# Patient Record
Sex: Female | Born: 1975 | Race: White | Hispanic: No | Marital: Married | Smoking: Never smoker
Health system: Southern US, Community
[De-identification: ages and names within clinical notes are randomized; demographics above are authoritative.]

---

## 1998-03-01 ENCOUNTER — Other Ambulatory Visit: Admission: RE | Admit: 1998-03-01 | Discharge: 1998-03-01 | Payer: Self-pay | Admitting: Obstetrics and Gynecology

## 1999-03-17 ENCOUNTER — Other Ambulatory Visit: Admission: RE | Admit: 1999-03-17 | Discharge: 1999-03-17 | Payer: Self-pay | Admitting: Obstetrics and Gynecology

## 2000-04-05 ENCOUNTER — Other Ambulatory Visit: Admission: RE | Admit: 2000-04-05 | Discharge: 2000-04-05 | Payer: Self-pay | Admitting: Gynecology

## 2000-07-19 ENCOUNTER — Other Ambulatory Visit: Admission: RE | Admit: 2000-07-19 | Discharge: 2000-07-19 | Payer: Self-pay | Admitting: Gynecology

## 2001-04-07 ENCOUNTER — Other Ambulatory Visit: Admission: RE | Admit: 2001-04-07 | Discharge: 2001-04-07 | Payer: Self-pay | Admitting: Gynecology

## 2002-05-05 ENCOUNTER — Other Ambulatory Visit: Admission: RE | Admit: 2002-05-05 | Discharge: 2002-05-05 | Payer: Self-pay | Admitting: Gynecology

## 2003-05-11 ENCOUNTER — Other Ambulatory Visit: Admission: RE | Admit: 2003-05-11 | Discharge: 2003-05-11 | Payer: Self-pay | Admitting: Gynecology

## 2003-12-05 ENCOUNTER — Other Ambulatory Visit: Admission: RE | Admit: 2003-12-05 | Discharge: 2003-12-05 | Payer: Self-pay | Admitting: Gynecology

## 2004-06-19 ENCOUNTER — Inpatient Hospital Stay (HOSPITAL_COMMUNITY): Admission: AD | Admit: 2004-06-19 | Discharge: 2004-06-21 | Payer: Self-pay | Admitting: Gynecology

## 2004-07-31 ENCOUNTER — Other Ambulatory Visit: Admission: RE | Admit: 2004-07-31 | Discharge: 2004-07-31 | Payer: Self-pay | Admitting: Gynecology

## 2005-08-10 ENCOUNTER — Other Ambulatory Visit: Admission: RE | Admit: 2005-08-10 | Discharge: 2005-08-10 | Payer: Self-pay | Admitting: Gynecology

## 2006-08-20 ENCOUNTER — Other Ambulatory Visit: Admission: RE | Admit: 2006-08-20 | Discharge: 2006-08-20 | Payer: Self-pay | Admitting: Gynecology

## 2007-08-30 ENCOUNTER — Other Ambulatory Visit: Admission: RE | Admit: 2007-08-30 | Discharge: 2007-08-30 | Payer: Self-pay | Admitting: Gynecology

## 2008-09-21 ENCOUNTER — Ambulatory Visit: Payer: Self-pay | Admitting: Gynecology

## 2008-09-21 ENCOUNTER — Encounter: Payer: Self-pay | Admitting: Gynecology

## 2008-09-21 ENCOUNTER — Other Ambulatory Visit: Admission: RE | Admit: 2008-09-21 | Discharge: 2008-09-21 | Payer: Self-pay | Admitting: Gynecology

## 2009-11-01 ENCOUNTER — Ambulatory Visit: Payer: Self-pay | Admitting: Gynecology

## 2009-11-18 ENCOUNTER — Ambulatory Visit: Payer: Self-pay | Admitting: Gynecology

## 2010-06-29 ENCOUNTER — Inpatient Hospital Stay (HOSPITAL_COMMUNITY): Admission: AD | Admit: 2010-06-29 | Discharge: 2010-07-01 | Payer: Self-pay | Admitting: Obstetrics and Gynecology

## 2010-11-12 LAB — RPR: RPR Ser Ql: NONREACTIVE

## 2010-11-12 LAB — CBC
Hemoglobin: 12.8 g/dL (ref 12.0–15.0)
MCV: 91.2 fL (ref 78.0–100.0)
Platelets: 293 10*3/uL (ref 150–400)
RBC: 4.16 MIL/uL (ref 3.87–5.11)
WBC: 12.1 10*3/uL — ABNORMAL HIGH (ref 4.0–10.5)

## 2011-03-17 ENCOUNTER — Encounter (INDEPENDENT_AMBULATORY_CARE_PROVIDER_SITE_OTHER): Payer: BC Managed Care – PPO | Admitting: Gynecology

## 2011-03-17 ENCOUNTER — Other Ambulatory Visit: Payer: Self-pay | Admitting: Gynecology

## 2011-03-17 ENCOUNTER — Other Ambulatory Visit (HOSPITAL_COMMUNITY)
Admission: RE | Admit: 2011-03-17 | Discharge: 2011-03-17 | Disposition: A | Discharge status: Home / Self Care | Payer: BC Managed Care – PPO | Source: Ambulatory Visit | Attending: Gynecology | Admitting: Gynecology

## 2011-03-17 DIAGNOSIS — Z124 Encounter for screening for malignant neoplasm of cervix: Secondary | ICD-10-CM | POA: Insufficient documentation

## 2011-03-17 DIAGNOSIS — Z01419 Encounter for gynecological examination (general) (routine) without abnormal findings: Secondary | ICD-10-CM

## 2011-03-17 DIAGNOSIS — Z13220 Encounter for screening for lipoid disorders: Secondary | ICD-10-CM

## 2011-03-17 DIAGNOSIS — B373 Candidiasis of vulva and vagina: Secondary | ICD-10-CM

## 2011-03-17 DIAGNOSIS — Z833 Family history of diabetes mellitus: Secondary | ICD-10-CM

## 2013-01-12 ENCOUNTER — Encounter: Payer: Self-pay | Admitting: Gynecology

## 2013-01-13 ENCOUNTER — Encounter: Payer: Self-pay | Admitting: Gynecology

## 2013-01-13 ENCOUNTER — Encounter: Payer: Self-pay | Admitting: Women's Health

## 2013-08-04 ENCOUNTER — Ambulatory Visit (INDEPENDENT_AMBULATORY_CARE_PROVIDER_SITE_OTHER): Payer: BC Managed Care – PPO | Admitting: Women's Health

## 2013-08-04 ENCOUNTER — Other Ambulatory Visit (HOSPITAL_COMMUNITY)
Admission: RE | Admit: 2013-08-04 | Discharge: 2013-08-04 | Disposition: A | Discharge status: Home / Self Care | Payer: BC Managed Care – PPO | Source: Ambulatory Visit | Attending: Gynecology | Admitting: Gynecology

## 2013-08-04 ENCOUNTER — Encounter: Payer: Self-pay | Admitting: Women's Health

## 2013-08-04 VITALS — BP 116/70 | Ht 63.5 in | Wt 164.8 lb

## 2013-08-04 DIAGNOSIS — Z01419 Encounter for gynecological examination (general) (routine) without abnormal findings: Secondary | ICD-10-CM | POA: Insufficient documentation

## 2013-08-04 DIAGNOSIS — Z13220 Encounter for screening for lipoid disorders: Secondary | ICD-10-CM

## 2013-08-04 LAB — URINALYSIS W MICROSCOPIC + REFLEX CULTURE
Casts: NONE SEEN
Crystals: NONE SEEN
Leukocytes, UA: NEGATIVE
Nitrite: NEGATIVE
Specific Gravity, Urine: 1.022 (ref 1.005–1.030)
pH: 5.5 (ref 5.0–8.0)

## 2013-08-04 LAB — CBC WITH DIFFERENTIAL/PLATELET
Lymphocytes Relative: 25 % (ref 12–46)
Lymphs Abs: 2.9 10*3/uL (ref 0.7–4.0)
MCV: 84.6 fL (ref 78.0–100.0)
Neutrophils Relative %: 65 % (ref 43–77)
Platelets: 333 10*3/uL (ref 150–400)
RBC: 4.74 MIL/uL (ref 3.87–5.11)
WBC: 11.9 10*3/uL — ABNORMAL HIGH (ref 4.0–10.5)

## 2013-08-04 LAB — LIPID PANEL
Total CHOL/HDL Ratio: 3.1 Ratio
VLDL: 26 mg/dL (ref 0–40)

## 2013-08-04 NOTE — Patient Instructions (Signed)

## 2013-08-04 NOTE — Addendum Note (Signed)
Addended by: Richardson Chiquito on: 08/04/2013 11:17 AM   Modules accepted: Orders

## 2013-08-04 NOTE — Progress Notes (Signed)
Stephanie Wheeler 07/22/1976 [id number]    History:    The patient presents for annual exam.  Regular monthly cycle/vasectomy. Normal Pap history.  Past medical history, past surgical history, family history and social history were all reviewed and documented in the EPIC chart. Curriculum facilitator at a school. [place] 7, Lequita Halt 3 both doing well. Mother fibromyalgia.   ROS:  A  ROS was performed and pertinent positives and negatives are included in the history.  Exam:  Filed Vitals:   08/04/13 0940  BP: 116/70    General appearance:  Normal Head/Neck:  Normal, without cervical or supraclavicular adenopathy. Thyroid:  Symmetrical, normal in size, without palpable masses or nodularity. Respiratory  Effort:  Normal  Auscultation:  Clear without wheezing or rhonchi Cardiovascular  Auscultation:  Regular rate, without rubs, murmurs or gallops  Edema/varicosities:  Not grossly evident Abdominal  Soft,nontender, without masses, guarding or rebound.  Liver/spleen:  No organomegaly noted  Hernia:  None appreciated  Skin  Inspection:  Grossly normal  Palpation:  Grossly normal Neurologic/psychiatric  Orientation:  Normal with appropriate conversation.  Mood/affect:  Normal  Genitourinary    Breasts: Examined lying and sitting.     Right: Without masses, retractions, discharge or axillary adenopathy.     Left: Without masses, retractions, discharge or axillary adenopathy.   Inguinal/mons:  Normal without inguinal adenopathy  External genitalia:  Normal  BUS/Urethra/Skene's glands:  Normal  Bladder:  Normal  Vagina:  Normal  Cervix:  Normal  Uterus:   normal in size, shape and contour.  Midline and mobile  Adnexa/parametria:     Rt: Without masses or tenderness.   Lt: Without masses or tenderness.  Anus and perineum: Normal  Digital rectal exam: Normal sphincter tone without palpated masses or tenderness  Assessment/Plan:  37 y.o. MWF G2P2  for annual exam with no  complaints.  Normal GYN exam/vasectomy  Plan: SBE's, regular exercise, calcium rich diet, vitamin D 1000 daily encouraged. CBC, lipid panel, UA, Pap. Pap normal 2012, new screening guidelines reviewed.     Harrington Challenger WHNP, 10:40 AM 08/04/2013

## 2013-10-09 ENCOUNTER — Encounter: Payer: Self-pay | Admitting: Gynecology

## 2013-10-09 ENCOUNTER — Ambulatory Visit (INDEPENDENT_AMBULATORY_CARE_PROVIDER_SITE_OTHER): Payer: BC Managed Care – PPO | Admitting: Gynecology

## 2013-10-09 DIAGNOSIS — N9089 Other specified noninflammatory disorders of vulva and perineum: Secondary | ICD-10-CM

## 2013-10-09 DIAGNOSIS — N907 Vulvar cyst: Secondary | ICD-10-CM

## 2013-10-09 NOTE — Patient Instructions (Signed)
followup if area does not resolve. Otherwise annually for your exam when due

## 2013-10-09 NOTE — Progress Notes (Signed)
Stephanie Wheeler 05/30/1976 [id number]        38 y.o.  [name] presents feeling a "paper cut" type feeling left labia over the last several days. Husband looked last night and saw a red raised area. No history of similar before. No urinary symptoms, vaginal discharge, abnormal bleeding.  Past medical history,surgical history, problem list, medications, allergies, family history and social history were all reviewed and documented in the EPIC chart.  Exam: Kim assistant General appearance  Normal  External BUS vagina with small pustule Outer mid left labia minora. Draining slight amounts of pus with pressure. No other vulvar lesions. Vagina normal to inspection and palpation. Cervix normal. Uterus normal size midline mobile nontender. Adnexa without masses or tenderness.  Assessment/Plan:  38 y.o. [name]  with small labial pustule now draining. Patient will follow and I suspect this will spontaneously resolve. Warm soaks discussed. Followup if persists,worsens or recurs.   Note: This document was prepared with digital dictation and possible smart phrase technology. Any transcriptional errors that result from this process are unintentional.   Dara LordsFONTAINE,Rodina Pinales P MD, 12:42 PM 10/09/2013

## 2013-10-11 ENCOUNTER — Ambulatory Visit: Payer: BC Managed Care – PPO | Admitting: Women's Health

## 2014-07-02 ENCOUNTER — Encounter: Payer: Self-pay | Admitting: Gynecology

## 2016-08-06 ENCOUNTER — Other Ambulatory Visit: Payer: Self-pay | Admitting: Women's Health

## 2016-08-06 DIAGNOSIS — Z1231 Encounter for screening mammogram for malignant neoplasm of breast: Secondary | ICD-10-CM

## 2016-08-27 ENCOUNTER — Ambulatory Visit (INDEPENDENT_AMBULATORY_CARE_PROVIDER_SITE_OTHER): Payer: BC Managed Care – PPO | Admitting: Women's Health

## 2016-08-27 ENCOUNTER — Encounter: Payer: Self-pay | Admitting: Women's Health

## 2016-08-27 VITALS — BP 120/78 | Ht 64.0 in | Wt 167.8 lb

## 2016-08-27 DIAGNOSIS — Z13220 Encounter for screening for lipoid disorders: Secondary | ICD-10-CM

## 2016-08-27 DIAGNOSIS — Z1329 Encounter for screening for other suspected endocrine disorder: Secondary | ICD-10-CM | POA: Diagnosis not present

## 2016-08-27 DIAGNOSIS — Z01419 Encounter for gynecological examination (general) (routine) without abnormal findings: Secondary | ICD-10-CM

## 2016-08-27 DIAGNOSIS — N920 Excessive and frequent menstruation with regular cycle: Secondary | ICD-10-CM | POA: Diagnosis not present

## 2016-08-27 DIAGNOSIS — Z1151 Encounter for screening for human papillomavirus (HPV): Secondary | ICD-10-CM | POA: Diagnosis not present

## 2016-08-27 LAB — CBC WITH DIFFERENTIAL/PLATELET
BASOS ABS: 87 {cells}/uL (ref 0–200)
Basophils Relative: 1 %
EOS ABS: 261 {cells}/uL (ref 15–500)
Eosinophils Relative: 3 %
HCT: 40.3 % (ref 35.0–45.0)
HEMOGLOBIN: 13.4 g/dL (ref 11.7–15.5)
LYMPHS ABS: 2610 {cells}/uL (ref 850–3900)
Lymphocytes Relative: 30 %
MCH: 27.9 pg (ref 27.0–33.0)
MCHC: 33.3 g/dL (ref 32.0–36.0)
MCV: 84 fL (ref 80.0–100.0)
MPV: 9.4 fL (ref 7.5–12.5)
Monocytes Absolute: 609 cells/uL (ref 200–950)
Monocytes Relative: 7 %
NEUTROS ABS: 5133 {cells}/uL (ref 1500–7800)
Neutrophils Relative %: 59 %
Platelets: 334 10*3/uL (ref 140–400)
RBC: 4.8 MIL/uL (ref 3.80–5.10)
RDW: 13.5 % (ref 11.0–15.0)
WBC: 8.7 10*3/uL (ref 3.8–10.8)

## 2016-08-27 LAB — COMPREHENSIVE METABOLIC PANEL
ALBUMIN: 4 g/dL (ref 3.6–5.1)
ALT: 17 U/L (ref 6–29)
AST: 20 U/L (ref 10–30)
Alkaline Phosphatase: 55 U/L (ref 33–115)
BUN: 17 mg/dL (ref 7–25)
CALCIUM: 9.1 mg/dL (ref 8.6–10.2)
CHLORIDE: 107 mmol/L (ref 98–110)
CO2: 22 mmol/L (ref 20–31)
Creat: 0.69 mg/dL (ref 0.50–1.10)
Glucose, Bld: 92 mg/dL (ref 65–99)
POTASSIUM: 4.5 mmol/L (ref 3.5–5.3)
Sodium: 137 mmol/L (ref 135–146)
TOTAL PROTEIN: 6.6 g/dL (ref 6.1–8.1)
Total Bilirubin: 0.3 mg/dL (ref 0.2–1.2)

## 2016-08-27 LAB — LIPID PANEL
CHOL/HDL RATIO: 2.8 ratio (ref <5.0)
CHOLESTEROL: 158 mg/dL (ref <200)
HDL: 56 mg/dL (ref >50)
LDL Cholesterol: 80 mg/dL (ref <100)
TRIGLYCERIDES: 111 mg/dL (ref <150)
VLDL: 22 mg/dL (ref <30)

## 2016-08-27 LAB — TSH: TSH: 1.44 mIU/L

## 2016-08-27 NOTE — Addendum Note (Signed)
Addended by: Aura CampsWEBB, JENNIFER L on: 08/27/2016 11:53 AM   Modules accepted: Orders

## 2016-08-27 NOTE — Progress Notes (Signed)
Stephanie Wheeler 03/31/1976 [id number]    History:    Presents for annual exam.  Monthly 5 day cycle ,2 days are heavy changing protection every hour. Vasectomy. Cycles have gotten progressively heavier since delivery of last child 6 years ago. Has not had a screening mammogram she does have one scheduled next week. Normal Pap history.  Past medical history, past surgical history, family history and social history were all reviewed and documented in the EPIC chart. 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  ROS:  A ROS was performed and pertinent positives and negatives are included.  Exam:  Vitals:   08/27/16 1053  BP: 120/78  Weight: 167 lb 12.8 oz (76.1 kg)  Height: 5\' 4"  (1.626 m)   Body mass index is 28.8 kg/m.   General appearance:  Normal Thyroid:  Symmetrical, normal in size, without palpable masses or nodularity. Respiratory  Auscultation:  Clear without wheezing or rhonchi Cardiovascular  Auscultation:  Regular rate, without rubs, murmurs or gallops  Edema/varicosities:  Not grossly evident Abdominal  Soft,nontender, without masses, guarding or rebound.  Liver/spleen:  No organomegaly noted  Hernia:  None appreciated  Skin  Inspection:  Grossly normal   Breasts: Examined lying and sitting.     Right: Without masses, retractions, discharge or axillary adenopathy.     Left: Without masses, retractions, discharge or axillary adenopathy. Gentitourinary   Inguinal/mons:  Normal without inguinal adenopathy  External genitalia:  Normal  BUS/Urethra/Skene's glands:  Normal  Vagina:  Normal  Cervix:  Normal  Uterus:   normal in size, shape and contour.  Midline and mobile  Adnexa/parametria:     Rt: Without masses or tenderness.   Lt: Without masses or tenderness.  Anus and perineum: Normal  Digital rectal exam: Normal sphincter tone without palpated masses or tenderness  Assessment/Plan:  40 y.o. MWF G2P2 for annual exam.      Monthly cycle with menorrhagia/vasectomy  Plan: Options reviewed, will schedule sonohysterogram with Dr. Audie BoxFontaine after next cycle, ablation reviewed, SBE's, keep scheduled annual screening mammogram reviewed importance of annual screen. Continue healthy lifestyle of exercise and healthy diet. CBC, lipid panel, CMP, TSH, vitamin D, UA, Pap with HR HPV typing, new screening guidelines reviewed.    [name],Stephanie Wheeler J [facility] CampusWHNP, 11:32 AM 08/27/2016

## 2016-08-27 NOTE — Patient Instructions (Signed)
Health Maintenance, Female Introduction Adopting a healthy lifestyle and getting preventive care can go a long way to promote health and wellness. Talk with your health care provider about what schedule of regular examinations is right for you. This is a good chance for you to check in with your provider about disease prevention and staying healthy. In between checkups, there are plenty of things you can do on your own. Experts have done a lot of research about which lifestyle changes and preventive measures are most likely to keep you healthy. Ask your health care provider for more information. Weight and diet Eat a healthy diet  Be sure to include plenty of vegetables, fruits, low-fat dairy products, and lean protein.  Do not eat a lot of foods high in solid fats, added sugars, or salt.  Get regular exercise. This is one of the most important things you can do for your health.  Most adults should exercise for at least 150 minutes each week. The exercise should increase your heart rate and make you sweat (moderate-intensity exercise).  Most adults should also do strengthening exercises at least twice a week. This is in addition to the moderate-intensity exercise. Maintain a healthy weight  Body mass index (BMI) is a measurement that can be used to identify possible weight problems. It estimates body fat based on height and weight. Your health care provider can help determine your BMI and help you achieve or maintain a healthy weight.  For females 42 years of age and older:  A BMI below 18.5 is considered underweight.  A BMI of 18.5 to 24.9 is normal.  A BMI of 25 to 29.9 is considered overweight.  A BMI of 30 and above is considered obese. Watch levels of cholesterol and blood lipids  You should start having your blood tested for lipids and cholesterol at 42 years of age, then have this test every 5 years.  You may need to have your cholesterol levels checked more often if:  Your  lipid or cholesterol levels are high.  You are older than 42 years of age.  You are at high risk for heart disease. Cancer screening Lung Cancer  Lung cancer screening is recommended for adults 42-22 years old who are at high risk for lung cancer because of a history of smoking.  A yearly low-dose CT scan of the lungs is recommended for people who:  Currently smoke.  Have quit within the past 15 years.  Have at least a 30-pack-year history of smoking. A pack year is smoking an average of one pack of cigarettes a day for 1 year.  Yearly screening should continue until it has been 15 years since you quit.  Yearly screening should stop if you develop a health problem that would prevent you from having lung cancer treatment. Breast Cancer  Practice breast self-awareness. This means understanding how your breasts normally appear and feel.  It also means doing regular breast self-exams. Let your health care provider know about any changes, no matter how small.  If you are in your 42s or 30s, you should have a clinical breast exam (CBE) by a health care provider every 1-3 years as part of a regular health exam.  If you are 42 or older, have a CBE every year. Also consider having a breast X-ray (mammogram) every year.  If you have a family history of breast cancer, talk to your health care provider about genetic screening.  If you are at high risk for breast cancer,  talk to your health care provider about having an MRI and a mammogram every year.  Breast cancer gene (BRCA) assessment is recommended for women who have family members with BRCA-related cancers. BRCA-related cancers include:  Breast.  Ovarian.  Tubal.  Peritoneal cancers.  Results of the assessment will determine the need for genetic counseling and BRCA1 and BRCA2 testing. Cervical Cancer  Your health care provider may recommend that you be screened regularly for cancer of the pelvic organs (ovaries, uterus, and  vagina). This screening involves a pelvic examination, including checking for microscopic changes to the surface of your cervix (Pap test). You may be encouraged to have this screening done every 3 years, beginning at age 42.  For women ages 30-65, health care providers may recommend pelvic exams and Pap testing every 3 years, or they may recommend the Pap and pelvic exam, combined with testing for human papilloma virus (HPV), every 5 years. Some types of HPV increase your risk of cervical cancer. Testing for HPV may also be done on women of any age with unclear Pap test results.  Other health care providers may not recommend any screening for nonpregnant women who are considered low risk for pelvic cancer and who do not have symptoms. Ask your health care provider if a screening pelvic exam is right for you.  If you have had past treatment for cervical cancer or a condition that could lead to cancer, you need Pap tests and screening for cancer for at least 20 years after your treatment. If Pap tests have been discontinued, your risk factors (such as having a new sexual partner) need to be reassessed to determine if screening should resume. Some women have medical problems that increase the chance of getting cervical cancer. In these cases, your health care provider may recommend more frequent screening and Pap tests. Colorectal Cancer  This type of cancer can be detected and often prevented.  Routine colorectal cancer screening usually begins at 42 years of age and continues through 42 years of age.  Your health care provider may recommend screening at an earlier age if you have risk factors for colon cancer.  Your health care provider may also recommend using home test kits to check for hidden blood in the stool.  A small camera at the end of a tube can be used to examine your colon directly (sigmoidoscopy or colonoscopy). This is done to check for the earliest forms of colorectal  cancer.  Routine screening usually begins at age 50.  Direct examination of the colon should be repeated every 5-10 years through 42 years of age. However, you may need to be screened more often if early forms of precancerous polyps or small growths are found. Skin Cancer  Check your skin from head to toe regularly.  Tell your health care provider about any new moles or changes in moles, especially if there is a change in a mole's shape or color.  Also tell your health care provider if you have a mole that is larger than the size of a pencil eraser.  Always use sunscreen. Apply sunscreen liberally and repeatedly throughout the day.  Protect yourself by wearing long sleeves, pants, a wide-brimmed hat, and sunglasses whenever you are outside. Heart disease, diabetes, and high blood pressure  High blood pressure causes heart disease and increases the risk of stroke. High blood pressure is more likely to develop in:  People who have blood pressure in the high end of the normal range (130-139/85-89 mm Hg).    People who are overweight or obese.  People who are African American.  If you are 18-39 years of age, have your blood pressure checked every 3-5 years. If you are 40 years of age or older, have your blood pressure checked every year. You should have your blood pressure measured twice-once when you are at a hospital or clinic, and once when you are not at a hospital or clinic. Record the average of the two measurements. To check your blood pressure when you are not at a hospital or clinic, you can use:  An automated blood pressure machine at a pharmacy.  A home blood pressure monitor.  If you are between 55 years and 79 years old, ask your health care provider if you should take aspirin to prevent strokes.  Have regular diabetes screenings. This involves taking a blood sample to check your fasting blood sugar level.  If you are at a normal weight and have a low risk for diabetes,  have this test once every three years after 42 years of age.  If you are overweight and have a high risk for diabetes, consider being tested at a younger age or more often. Preventing infection Hepatitis B  If you have a higher risk for hepatitis B, you should be screened for this virus. You are considered at high risk for hepatitis B if:  You were born in a country where hepatitis B is common. Ask your health care provider which countries are considered high risk.  Your parents were born in a high-risk country, and you have not been immunized against hepatitis B (hepatitis B vaccine).  You have HIV or AIDS.  You use needles to inject street drugs.  You live with someone who has hepatitis B.  You have had sex with someone who has hepatitis B.  You get hemodialysis treatment.  You take certain medicines for conditions, including cancer, organ transplantation, and autoimmune conditions. Hepatitis C  Blood testing is recommended for:  Everyone born from 1945 through 1965.  Anyone with known risk factors for hepatitis C. Sexually transmitted infections (STIs)  You should be screened for sexually transmitted infections (STIs) including gonorrhea and chlamydia if:  You are sexually active and are younger than 42 years of age.  You are older than 42 years of age and your health care provider tells you that you are at risk for this type of infection.  Your sexual activity has changed since you were last screened and you are at an increased risk for chlamydia or gonorrhea. Ask your health care provider if you are at risk.  If you do not have HIV, but are at risk, it may be recommended that you take a prescription medicine daily to prevent HIV infection. This is called pre-exposure prophylaxis (PrEP). You are considered at risk if:  You are sexually active and do not regularly use condoms or know the HIV status of your partner(s).  You take drugs by injection.  You are sexually  active with a partner who has HIV. Talk with your health care provider about whether you are at high risk of being infected with HIV. If you choose to begin PrEP, you should first be tested for HIV. You should then be tested every 3 months for as long as you are taking PrEP. Pregnancy  If you are premenopausal and you may become pregnant, ask your health care provider about preconception counseling.  If you may become pregnant, take 400 to 800 micrograms (mcg) of folic acid   every day.  If you want to prevent pregnancy, talk to your health care provider about birth control (contraception). Osteoporosis and menopause  Osteoporosis is a disease in which the bones lose minerals and strength with aging. This can result in serious bone fractures. Your risk for osteoporosis can be identified using a bone density scan.  If you are 65 years of age or older, or if you are at risk for osteoporosis and fractures, ask your health care provider if you should be screened.  Ask your health care provider whether you should take a calcium or vitamin D supplement to lower your risk for osteoporosis.  Menopause may have certain physical symptoms and risks.  Hormone replacement therapy may reduce some of these symptoms and risks. Talk to your health care provider about whether hormone replacement therapy is right for you. Follow these instructions at home:  Schedule regular health, dental, and eye exams.  Stay current with your immunizations.  Do not use any tobacco products including cigarettes, chewing tobacco, or electronic cigarettes.  If you are pregnant, do not drink alcohol.  If you are breastfeeding, limit how much and how often you drink alcohol.  Limit alcohol intake to no more than 1 drink per day for nonpregnant women. One drink equals 12 ounces of beer, 5 ounces of wine, or 1 ounces of hard liquor.  Do not use street drugs.  Do not share needles.  Ask your health care provider for  help if you need support or information about quitting drugs.  Tell your health care provider if you often feel depressed.  Tell your health care provider if you have ever been abused or do not feel safe at home. This information is not intended to replace advice given to you by your health care provider. Make sure you discuss any questions you have with your health care provider. Document Released: 03/02/2011 Document Revised: 01/23/2016 Document Reviewed: 05/21/2015  2017 Elsevier  Endometrial Ablation Endometrial ablation removes the lining of the uterus (endometrium). It is usually a same-day, outpatient treatment. Ablation helps avoid major surgery, such as surgery to remove the cervix and uterus (hysterectomy). After endometrial ablation, you will have little or no menstrual bleeding and may not be able to have children. However, if you are premenopausal, you will need to use a reliable method of birth control following the procedure because of the small chance that pregnancy can occur. There are different reasons to have this procedure. These reasons include:  Heavy periods.  Bleeding that is causing anemia.  Irregular bleeding.  Bleeding fibroids on the lining inside the uterus if they are smaller than 3 centimeters. This procedure may not be possible for you if:   You want to have children in the future.   You have severe cramps with your menstrual period.   You have precancerous or cancerous cells in your uterus.   You were recently pregnant.   You have gone through menopause.   You have had major surgery on your uterus, resulting in thinning of the uterine wall. Surgeries may include:  The removal of one or more uterine fibroids (myomectomy).  A cesarean section with a classic (vertical) incision on your uterus. Ask your health care provider what type of cesarean you had. Sometimes the scar on your skin is different than the scar on your uterus. Even if you have  had surgery on your uterus, certain types of ablation may still be safe for you. Talk with your health care provider. LET YOUR   HEALTH CARE PROVIDER KNOW ABOUT:  Any allergies you have.  All medicines you are taking, including vitamins, herbs, eye drops, creams, and over-the-counter medicines.  Previous problems you or members of your family have had with the use of anesthetics.  Any blood disorders you have.  Previous surgeries you have had.  Medical conditions you have. RISKS AND COMPLICATIONS  Generally, this is a safe procedure. However, as with any procedure, complications can occur. Possible complications include:  Perforation of the uterus.  Bleeding.  Infection of the uterus, bladder, or vagina.  Injury to surrounding organs.  An air bubble to the lung (air embolus).  Pregnancy following the procedure.  Failure of the procedure to help the problem, requiring hysterectomy.  Decreased ability to diagnose cancer in the lining of the uterus. BEFORE THE PROCEDURE  The lining of the uterus must be tested to make sure there is no pre-cancerous or cancer cells present.  An ultrasound may be performed to look at the size of the uterus and to check for abnormalities.  Medicines may be given to thin the lining of the uterus. PROCEDURE  During the procedure, your health care provider will use a tool called a resectoscope to help see inside your uterus. There are different ways to remove the lining of your uterus.   Radiofrequency - This method uses a radiofrequency-alternating electric current to remove the lining of the uterus.  Cryotherapy - This method uses extreme cold to freeze the lining of the uterus.  Heated-Free Liquid - This method uses heated salt (saline) solution to remove the lining of the uterus.  Microwave - This method uses high-energy microwaves to heat up the lining of the uterus to remove it.  Thermal balloon - This method involves inserting a catheter  with a balloon tip into the uterus. The balloon tip is filled with heated fluid to remove the lining of the uterus. AFTER THE PROCEDURE  After your procedure, do not have sexual intercourse or insert anything into your vagina until permitted by your health care provider. After the procedure, you may experience:  Cramps.  Vaginal discharge.  Frequent urination. This information is not intended to replace advice given to you by your health care provider. Make sure you discuss any questions you have with your health care provider. Document Released: 06/26/2004 Document Revised: 05/08/2015 Document Reviewed: 01/18/2013 Elsevier Interactive Patient Education  2017 [employer]  

## 2016-08-28 ENCOUNTER — Encounter: Payer: Self-pay | Admitting: Women's Health

## 2016-08-28 LAB — URINALYSIS W MICROSCOPIC + REFLEX CULTURE
Bacteria, UA: NONE SEEN [HPF]
Bilirubin Urine: NEGATIVE
CASTS: NONE SEEN [LPF]
CRYSTALS: NONE SEEN [HPF]
Glucose, UA: NEGATIVE
HGB URINE DIPSTICK: NEGATIVE
KETONES UR: NEGATIVE
Leukocytes, UA: NEGATIVE
NITRITE: NEGATIVE
PH: 6 (ref 5.0–8.0)
Protein, ur: NEGATIVE
RBC / HPF: NONE SEEN RBC/HPF (ref <=2)
SQUAMOUS EPITHELIAL / LPF: NONE SEEN [HPF] (ref <=5)
Specific Gravity, Urine: 1.01 (ref 1.001–1.035)
WBC, UA: NONE SEEN WBC/HPF (ref <=5)
Yeast: NONE SEEN [HPF]

## 2016-08-28 LAB — VITAMIN D 25 HYDROXY (VIT D DEFICIENCY, FRACTURES): Vit D, 25-Hydroxy: 44 ng/mL (ref 30–100)

## 2016-09-01 ENCOUNTER — Ambulatory Visit
Admission: RE | Admit: 2016-09-01 | Discharge: 2016-09-01 | Disposition: A | Discharge status: Home / Self Care | Payer: BC Managed Care – PPO | Source: Ambulatory Visit | Attending: Women's Health | Admitting: Women's Health

## 2016-09-01 ENCOUNTER — Encounter: Payer: Self-pay | Admitting: Women's Health

## 2016-09-01 DIAGNOSIS — Z1231 Encounter for screening mammogram for malignant neoplasm of breast: Secondary | ICD-10-CM

## 2016-09-01 LAB — PAP, TP IMAGING W/ HPV RNA, RFLX HPV TYPE 16,18/45: HPV mRNA, High Risk: NOT DETECTED

## 2019-06-05 ENCOUNTER — Other Ambulatory Visit: Payer: Self-pay | Admitting: Women's Health

## 2019-06-05 DIAGNOSIS — Z1231 Encounter for screening mammogram for malignant neoplasm of breast: Secondary | ICD-10-CM

## 2019-07-11 ENCOUNTER — Other Ambulatory Visit: Payer: Self-pay

## 2019-07-12 ENCOUNTER — Encounter: Payer: Self-pay | Admitting: Women's Health

## 2019-07-12 ENCOUNTER — Ambulatory Visit: Payer: BC Managed Care – PPO | Admitting: Women's Health

## 2019-07-12 VITALS — BP 110/78 | Ht 64.0 in | Wt 167.0 lb

## 2019-07-12 DIAGNOSIS — Z23 Encounter for immunization: Secondary | ICD-10-CM

## 2019-07-12 DIAGNOSIS — Z01419 Encounter for gynecological examination (general) (routine) without abnormal findings: Secondary | ICD-10-CM | POA: Diagnosis not present

## 2019-07-12 NOTE — Patient Instructions (Signed)
Vit D 1000 iu daily good to see you! Health Maintenance, Female Adopting a healthy lifestyle and getting preventive care are important in promoting health and wellness. Ask your health care provider about:  The right schedule for you to have regular tests and exams.  Things you can do on your own to prevent diseases and keep yourself healthy. What should I know about diet, weight, and exercise? Eat a healthy diet   Eat a diet that includes plenty of vegetables, fruits, low-fat dairy products, and lean protein.  Do not eat a lot of foods that are high in solid fats, added sugars, or sodium. Maintain a healthy weight Body mass index (BMI) is used to identify weight problems. It estimates body fat based on height and weight. Your health care provider can help determine your BMI and help you achieve or maintain a healthy weight. Get regular exercise Get regular exercise. This is one of the most important things you can do for your health. Most adults should:  Exercise for at least 150 minutes each week. The exercise should increase your heart rate and make you sweat (moderate-intensity exercise).  Do strengthening exercises at least twice a week. This is in addition to the moderate-intensity exercise.  Spend less time sitting. Even light physical activity can be beneficial. Watch cholesterol and blood lipids Have your blood tested for lipids and cholesterol at 45 years of age, then have this test every 5 years. Have your cholesterol levels checked more often if:  Your lipid or cholesterol levels are high.  You are older than 45 years of age.  You are at high risk for heart disease. What should I know about cancer screening? Depending on your health history and family history, you may need to have cancer screening at various ages. This may include screening for:  Breast cancer.  Cervical cancer.  Colorectal cancer.  Skin cancer.  Lung cancer. What should I know about heart  disease, diabetes, and high blood pressure? Blood pressure and heart disease  High blood pressure causes heart disease and increases the risk of stroke. This is more likely to develop in people who have high blood pressure readings, are of African descent, or are overweight.  Have your blood pressure checked: ? Every 3-5 years if you are 36-89 years of age. ? Every year if you are 24 years old or older. Diabetes Have regular diabetes screenings. This checks your fasting blood sugar level. Have the screening done:  Once every three years after age 45 if you are at a normal weight and have a low risk for diabetes.  More often and at a younger age if you are overweight or have a high risk for diabetes. What should I know about preventing infection? Hepatitis B If you have a higher risk for hepatitis B, you should be screened for this virus. Talk with your health care provider to find out if you are at risk for hepatitis B infection. Hepatitis C Testing is recommended for:  Everyone born from [birth year] through 1967.  Anyone with known risk factors for hepatitis C. Sexually transmitted infections (STIs)  Get screened for STIs, including gonorrhea and chlamydia, if: ? You are sexually active and are younger than 45 years of age. ? You are older than 46 years of age and your health care provider tells you that you are at risk for this type of infection. ? Your sexual activity has changed since you were last screened, and you are at increased risk for  chlamydia or gonorrhea. Ask your health care provider if you are at risk.  Ask your health care provider about whether you are at high risk for HIV. Your health care provider may recommend a prescription medicine to help prevent HIV infection. If you choose to take medicine to prevent HIV, you should first get tested for HIV. You should then be tested every 3 months for as long as you are taking the medicine. Pregnancy  If you are about to stop  having your period (premenopausal) and you may become pregnant, seek counseling before you get pregnant.  Take 400 to 800 micrograms (mcg) of folic acid every day if you become pregnant.  Ask for birth control (contraception) if you want to prevent pregnancy. Osteoporosis and menopause Osteoporosis is a disease in which the bones lose minerals and strength with aging. This can result in bone fractures. If you are 65 years old or older, or if you are at risk for osteoporosis and fractures, ask your health care provider if you should:  Be screened for bone loss.  Take a calcium or vitamin D supplement to lower your risk of fractures.  Be given hormone replacement therapy (HRT) to treat symptoms of menopause. Follow these instructions at home: Lifestyle  Do not use any products that contain nicotine or tobacco, such as cigarettes, e-cigarettes, and chewing tobacco. If you need help quitting, ask your health care provider.  Do not use street drugs.  Do not share needles.  Ask your health care provider for help if you need support or information about quitting drugs. Alcohol use  Do not drink alcohol if: ? Your health care provider tells you not to drink. ? You are pregnant, may be pregnant, or are planning to become pregnant.  If you drink alcohol: ? Limit how much you use to 0-1 drink a day. ? Limit intake if you are breastfeeding.  Be aware of how much alcohol is in your drink. In the [place] one drink equals one 12 oz bottle of beer (355 mL), one 5 oz glass of wine (148 mL), or one 1 oz glass of hard liquor (44 mL). General instructions  Schedule regular health, dental, and eye exams.  Stay current with your vaccines.  Tell your health care provider if: ? You often feel depressed. ? You have ever been abused or do not feel safe at home. Summary  Adopting a healthy lifestyle and getting preventive care are important in promoting health and wellness.  Follow your health  care provider's instructions about healthy diet, exercising, and getting tested or screened for diseases.  Follow your health care provider's instructions on monitoring your cholesterol and blood pressure. This information is not intended to replace advice given to you by your health care provider. Make sure you discuss any questions you have with your health care provider. Document Released: 03/02/2011 Document Revised: 08/10/2018 Document Reviewed: 08/10/2018 Elsevier Patient Education  2020 [employer]vier Inc[employer]  

## 2019-07-12 NOTE — Progress Notes (Signed)
TOSCA PLETZ 14-Jun-1976 [id number]    History:    Presents for annual exam.  Regular monthly cycle/vasectomy.  Normal Pap and mammogram history.  No new medical problems.  Past medical history, past surgical history, family history and social history were all reviewed and documented in the EPIC chart.  Curriculum facilitator.  2 daughters ages 62 and 2 both doing well.  ROS:  A ROS was performed and pertinent positives and negatives are included.  Exam:  Vitals:   07/12/19 1158  BP: 110/78  Weight: 167 lb (75.8 kg)  Height: 5\' 4"  (1.626 m)   Body mass index is 28.67 kg/m.   General appearance:  Normal Thyroid:  Symmetrical, normal in size, without palpable masses or nodularity. Respiratory  Auscultation:  Clear without wheezing or rhonchi Cardiovascular  Auscultation:  Regular rate, without rubs, murmurs or gallops  Edema/varicosities:  Not grossly evident Abdominal  Soft,nontender, without masses, guarding or rebound.  Liver/spleen:  No organomegaly noted  Hernia:  None appreciated  Skin  Inspection:  Grossly normal   Breasts: Examined lying and sitting.     Right: Without masses, retractions, discharge or axillary adenopathy.     Left: Without masses, retractions, discharge or axillary adenopathy. Gentitourinary   Inguinal/mons:  Normal without inguinal adenopathy  External genitalia:  Normal  BUS/Urethra/Skene's glands:  Normal  Vagina:  Normal  Cervix:  Normal  Uterus:  normal in size, shape and contour.  Midline and mobile  Adnexa/parametria:     Rt: Without masses or tenderness.   Lt: Without masses or tenderness.  Anus and perineum: Normal  Digital rectal exam: Normal sphincter tone without palpated masses or tenderness  Assessment/Plan:  43 y.o. MWF G2 P2 for annual exam with no complaints.  Regular monthly cycle/vasectomy  Plan: SBEs, annual screening mammogram overdue as scheduled reviewed importance of keeping appointment.  Continue healthy  lifestyle of regular exercise, calcium rich foods, vitamin D 2000 daily encouraged.  CBC, CMP, Pap with HR HPV typing.  Pap normal with negative HR HPV 2017 at last office visit.  Screening guidelines reviewed.  Instructed to come fasting next year for lipid panel.  (Normal lipid panel in the past.)    Huel Cote [facility], 12:24 PM 07/12/2019

## 2019-07-13 LAB — PAP, TP IMAGING W/ HPV RNA, RFLX HPV TYPE 16,18/45: HPV DNA High Risk: NOT DETECTED

## 2019-07-13 LAB — CBC WITH DIFFERENTIAL/PLATELET
Absolute Monocytes: 907 cells/uL (ref 200–950)
Basophils Absolute: 134 cells/uL (ref 0–200)
Basophils Relative: 1.2 %
Eosinophils Absolute: 392 cells/uL (ref 15–500)
Eosinophils Relative: 3.5 %
HCT: 41.6 % (ref 35.0–45.0)
Hemoglobin: 13.7 g/dL (ref 11.7–15.5)
Lymphs Abs: 2486 cells/uL (ref 850–3900)
MCH: 28.7 pg (ref 27.0–33.0)
MCHC: 32.9 g/dL (ref 32.0–36.0)
MCV: 87 fL (ref 80.0–100.0)
MPV: 10.2 fL (ref 7.5–12.5)
Monocytes Relative: 8.1 %
Neutro Abs: 7280 cells/uL (ref 1500–7800)
Neutrophils Relative %: 65 %
Platelets: 343 10*3/uL (ref 140–400)
RBC: 4.78 10*6/uL (ref 3.80–5.10)
RDW: 12.5 % (ref 11.0–15.0)
Total Lymphocyte: 22.2 %
WBC: 11.2 10*3/uL — ABNORMAL HIGH (ref 3.8–10.8)

## 2019-07-13 LAB — COMPREHENSIVE METABOLIC PANEL
AG Ratio: 1.6 (calc) (ref 1.0–2.5)
ALT: 19 U/L (ref 6–29)
AST: 22 U/L (ref 10–35)
Albumin: 4 g/dL (ref 3.6–5.1)
Alkaline phosphatase (APISO): 52 U/L (ref 31–125)
BUN: 20 mg/dL (ref 7–25)
CO2: 23 mmol/L (ref 20–32)
Calcium: 9.4 mg/dL (ref 8.6–10.2)
Chloride: 106 mmol/L (ref 98–110)
Creat: 0.71 mg/dL (ref 0.50–1.10)
Globulin: 2.5 g/dL (calc) (ref 1.9–3.7)
Glucose, Bld: 88 mg/dL (ref 65–99)
Potassium: 4.2 mmol/L (ref 3.5–5.3)
Sodium: 139 mmol/L (ref 135–146)
Total Bilirubin: 0.3 mg/dL (ref 0.2–1.2)
Total Protein: 6.5 g/dL (ref 6.1–8.1)

## 2019-07-19 ENCOUNTER — Ambulatory Visit: Payer: BC Managed Care – PPO

## 2019-09-05 ENCOUNTER — Ambulatory Visit: Payer: BC Managed Care – PPO

## 2019-09-19 ENCOUNTER — Ambulatory Visit
Admission: RE | Admit: 2019-09-19 | Discharge: 2019-09-19 | Disposition: A | Discharge status: Home / Self Care | Payer: BC Managed Care – PPO | Source: Ambulatory Visit | Attending: Women's Health | Admitting: Women's Health

## 2019-09-19 ENCOUNTER — Other Ambulatory Visit: Payer: Self-pay

## 2019-09-19 DIAGNOSIS — Z1231 Encounter for screening mammogram for malignant neoplasm of breast: Secondary | ICD-10-CM

## 2021-05-29 ENCOUNTER — Other Ambulatory Visit: Payer: Self-pay | Admitting: Nurse Practitioner

## 2021-05-29 DIAGNOSIS — Z1231 Encounter for screening mammogram for malignant neoplasm of breast: Secondary | ICD-10-CM

## 2021-07-08 ENCOUNTER — Other Ambulatory Visit: Payer: Self-pay

## 2021-07-08 ENCOUNTER — Encounter: Payer: Self-pay | Admitting: Nurse Practitioner

## 2021-07-08 ENCOUNTER — Ambulatory Visit
Admission: RE | Admit: 2021-07-08 | Discharge: 2021-07-08 | Disposition: A | Discharge status: Home / Self Care | Payer: BC Managed Care – PPO | Source: Ambulatory Visit | Attending: Nurse Practitioner | Admitting: Nurse Practitioner

## 2021-07-08 ENCOUNTER — Ambulatory Visit (INDEPENDENT_AMBULATORY_CARE_PROVIDER_SITE_OTHER): Payer: BC Managed Care – PPO | Admitting: Nurse Practitioner

## 2021-07-08 VITALS — BP 124/78 | Ht 63.0 in | Wt 171.0 lb

## 2021-07-08 DIAGNOSIS — N920 Excessive and frequent menstruation with regular cycle: Secondary | ICD-10-CM

## 2021-07-08 DIAGNOSIS — Z01419 Encounter for gynecological examination (general) (routine) without abnormal findings: Secondary | ICD-10-CM | POA: Diagnosis not present

## 2021-07-08 DIAGNOSIS — Z1231 Encounter for screening mammogram for malignant neoplasm of breast: Secondary | ICD-10-CM

## 2021-07-08 NOTE — Progress Notes (Signed)
Stephanie Wheeler 1976-06-04 [id number]   History:  45 y.o. G2P2002 presents for annual exam. Monthly cycles. Cycles have become heavier over the last year. Heavy bleeding days 2-3 that sometimes require super plus tampon and large pad changes every 2-3 hours. No cramping. Having some night sweats. Normal pap and mammogram history.   Gynecologic History Patient's last menstrual period was 06/24/2021. Period Cycle (Days): 28 Period Duration (Days): 6 Period Pattern: Regular Menstrual Flow: Heavy Dysmenorrhea: None Contraception/Family planning: vasectomy Sexually active: Yes  Health Maintenance Last Pap: 07/12/2019. Results were: Normal, 5-year repeat Last mammogram: 07/08/2021. Results were: No report yet Last colonoscopy: Never Last Dexa: Not indicated  Past medical history, past surgical history, family history and social history were all reviewed and documented in the EPIC chart. 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 Both swim.   ROS:  A ROS was performed and pertinent positives and negatives are included.  Exam:  Vitals:   07/08/21 1208  BP: 124/78  Weight: 171 lb (77.6 kg)  Height: 5\' 3"  (1.6 m)   Body mass index is 30.29 kg/m.  General appearance:  Normal Thyroid:  Symmetrical, normal in size, without palpable masses or nodularity. Respiratory  Auscultation:  Clear without wheezing or rhonchi Cardiovascular  Auscultation:  Regular rate, without rubs, murmurs or gallops  Edema/varicosities:  Not grossly evident Abdominal  Soft,nontender, without masses, guarding or rebound.  Liver/spleen:  No organomegaly noted  Hernia:  None appreciated  Skin  Inspection:  Grossly normal Breasts: Examined lying and sitting.   Right: Without masses, retractions, nipple discharge or axillary adenopathy.   Left: Without masses, retractions, nipple discharge or axillary adenopathy. Genitourinary   Inguinal/mons:  Normal without inguinal  adenopathy  External genitalia:  Normal appearing vulva with no masses, tenderness, or lesions  BUS/Urethra/Skene's glands:  Normal  Vagina:  Normal appearing with normal color and discharge, no lesions  Cervix:  Normal appearing without discharge or lesions  Uterus:  Difficult to palpate but no gross masses or tenderness  Adnexa/parametria:     Rt: Normal in size, without masses or tenderness.   Lt: Normal in size, without masses or tenderness.  Anus and perineum: Normal  Digital rectal exam: Normal sphincter tone without palpated masses or tenderness  Patient informed chaperone available to be present for breast and pelvic exam. Patient has requested no chaperone to be present. Patient has been advised what will be completed during breast and pelvic exam.   Assessment/Plan:  45 y.o. 55 for annual exam.   Well female exam with routine gynecological exam - Plan: CBC with Differential/Platelet, Comprehensive metabolic panel. Education provided on SBEs, importance of preventative screenings, current guidelines, high calcium diet, regular exercise, and multivitamin daily.   Menorrhagia with regular cycle - Monthly cycles. Cycles have become heavier over the last year. Heavy bleeding days 2-3 that sometimes require super plus tampon and large pad changes every 2-3 hours. No cramping. Discussed management options and she is interested in Mirena IUD. She has been on OCPs and Depo in the past. Decreased Libido with OCPs and does not want to take again.   Screening for cervical cancer - Normal Pap history.  Will repeat at 5-year interval per guidelines.  Screening for breast cancer - Normal mammogram history.  Continue annual screenings (mammogram done today). Normal breast exam today.  Screening for colon cancer - Average risk. Discussed current guidelines and recommendations to start screenings at age 30.   Return in 1 year for  annual.   Olivia Mackie DNP, 12:23 PM 07/08/2021

## 2021-07-09 LAB — CBC WITH DIFFERENTIAL/PLATELET
Absolute Monocytes: 856 cells/uL (ref 200–950)
Basophils Absolute: 107 cells/uL (ref 0–200)
Basophils Relative: 1 %
Eosinophils Absolute: 257 cells/uL (ref 15–500)
Eosinophils Relative: 2.4 %
HCT: 42.8 % (ref 35.0–45.0)
Hemoglobin: 14.1 g/dL (ref 11.7–15.5)
Lymphs Abs: 1937 cells/uL (ref 850–3900)
MCH: 28.7 pg (ref 27.0–33.0)
MCHC: 32.9 g/dL (ref 32.0–36.0)
MCV: 87.2 fL (ref 80.0–100.0)
MPV: 9.6 fL (ref 7.5–12.5)
Monocytes Relative: 8 %
Neutro Abs: 7544 cells/uL (ref 1500–7800)
Neutrophils Relative %: 70.5 %
Platelets: 379 10*3/uL (ref 140–400)
RBC: 4.91 10*6/uL (ref 3.80–5.10)
RDW: 12.2 % (ref 11.0–15.0)
Total Lymphocyte: 18.1 %
WBC: 10.7 10*3/uL (ref 3.8–10.8)

## 2021-07-09 LAB — COMPREHENSIVE METABOLIC PANEL
AG Ratio: 1.5 (calc) (ref 1.0–2.5)
ALT: 13 U/L (ref 6–29)
AST: 17 U/L (ref 10–35)
Albumin: 4.3 g/dL (ref 3.6–5.1)
Alkaline phosphatase (APISO): 52 U/L (ref 31–125)
BUN: 17 mg/dL (ref 7–25)
CO2: 20 mmol/L (ref 20–32)
Calcium: 9.7 mg/dL (ref 8.6–10.2)
Chloride: 108 mmol/L (ref 98–110)
Creat: 0.77 mg/dL (ref 0.50–0.99)
Globulin: 2.8 g/dL (calc) (ref 1.9–3.7)
Glucose, Bld: 94 mg/dL (ref 65–99)
Potassium: 4.2 mmol/L (ref 3.5–5.3)
Sodium: 141 mmol/L (ref 135–146)
Total Bilirubin: 0.4 mg/dL (ref 0.2–1.2)
Total Protein: 7.1 g/dL (ref 6.1–8.1)

## 2021-08-19 ENCOUNTER — Other Ambulatory Visit: Payer: Self-pay

## 2021-08-19 ENCOUNTER — Encounter: Payer: Self-pay | Admitting: Nurse Practitioner

## 2021-08-19 ENCOUNTER — Ambulatory Visit: Payer: BC Managed Care – PPO | Admitting: Nurse Practitioner

## 2021-08-19 VITALS — BP 118/76

## 2021-08-19 DIAGNOSIS — N920 Excessive and frequent menstruation with regular cycle: Secondary | ICD-10-CM

## 2021-08-19 DIAGNOSIS — Z30430 Encounter for insertion of intrauterine contraceptive device: Secondary | ICD-10-CM

## 2021-08-19 MED ORDER — LEVONORGESTREL 20 MCG/DAY IU IUD
1.0000 | INTRAUTERINE_SYSTEM | Freq: Once | INTRAUTERINE | Status: DC
Start: 2021-08-19 — End: 2022-07-09

## 2021-08-19 NOTE — Progress Notes (Signed)
° °  Stephanie Wheeler 1976/01/21 [id number]   History:  45 y.o. G2P2002 presents for insertion of Mirena IUD.  Pt has been counseled about risks and benefits as well as complications.  Consent is obtained today. She has been experiencing heavy menses.   Patient's last menstrual period was 08/14/2021. Husband has Vasectomy STD testing: Declines  Past medical history, past surgical history, family history and social history were all reviewed and documented in the EPIC chart.  ROS:  A ROS was performed and pertinent positives and negatives are included.  Exam: Vitals:   08/19/21 0920  BP: 118/76   There is no height or weight on file to calculate BMI.  Pelvic exam: Vulva:  normal female genitalia Vagina:  normal vagina, no discharge, exudate, lesion, or erythema Cervix:  Non-tender, Negative CMT, no lesions or redness. Uterus:  normal shape, position and consistency    Procedure:  Speculum inserted.   Cervix visualized and cleansed with Betadine x 3.  Tenaculum placed on anterior cervix. Then uterus sounded to 9 cm. IUD inserted easily. Strings trimmed to 3 cm.  Minimal bleeding noted.  Pt tolerated the procedure well.  Chaperone present: Kennon Portela, CMA   Assessment/Plan:  Insertion of Mirena IUD   Return for recheck 4-6 weeks Pt aware to call for any concerns Pt aware removal due no later than 8 years from insertion date, IUD card given to pt.   Olivia Mackie DNP, 9:27 AM 08/19/2021

## 2021-09-16 ENCOUNTER — Other Ambulatory Visit: Payer: Self-pay

## 2021-09-16 ENCOUNTER — Ambulatory Visit: Payer: BC Managed Care – PPO | Admitting: Nurse Practitioner

## 2021-09-16 VITALS — BP 116/78

## 2021-09-16 DIAGNOSIS — Z30431 Encounter for routine checking of intrauterine contraceptive device: Secondary | ICD-10-CM

## 2021-09-16 NOTE — Progress Notes (Signed)
° °  Acute Office Visit  Subjective:    Patient ID: Stephanie Wheeler, female    DOB: [birth year], 46 y.o.   MRN: [id number]   HPI 46 y.o. presents today for 4-week IUD follow up. Mirena IUD inserted 08/19/2021. Has had bleeding for about 13 days. Bleeding has ranged from moderate to light. Minimal cramping. No pain with intercourse.    Review of Systems  Constitutional: Negative.   Genitourinary:  Positive for vaginal bleeding.      Objective:    Physical Exam Constitutional:      Appearance: Normal appearance.  Genitourinary:    General: Normal vulva.     Vagina: Bleeding (Light) present.     Cervix: Normal.     Uterus: Normal.      Comments: IUD string visible about 3 cm   BP 116/78    LMP 08/04/2021  Wt Readings from Last 3 Encounters:  07/08/21 171 lb (77.6 kg)  07/12/19 167 lb (75.8 kg)  08/27/16 167 lb 12.8 oz (76.1 kg)        Assessment & Plan:   Problem List Items Addressed This Visit   None Visit Diagnoses     IUD check up    -  Primary      Plan: Reassured that irregular bleeding and number of bleeding days may increase during first 3-6 months with some even experiencing heavy bleeding during that time and that over time typically periods should become lighter and for some stop altogether. Will monitor bleeding for now and return as needed.     Olivia Mackie DNP, 2:49 PM 09/16/2021

## 2021-11-03 ENCOUNTER — Telehealth: Payer: Self-pay | Admitting: *Deleted

## 2021-11-03 NOTE — Telephone Encounter (Signed)
Patient called had IUD inserted in Dec. 2022, had 1 month follow up in Jan 2023 c/o bleeding during this visit as well. Patient reports she is now on day 12 of light bleeding, wearing a small tampon, changing about every 3 hours ( she is not changing q 3 hours due to flow). Patient said she is tired of bleeding, told to call if this continues. Please advise  ?

## 2021-11-04 ENCOUNTER — Other Ambulatory Visit: Payer: Self-pay | Admitting: Nurse Practitioner

## 2021-11-04 DIAGNOSIS — N921 Excessive and frequent menstruation with irregular cycle: Secondary | ICD-10-CM

## 2021-11-04 MED ORDER — MEGESTROL ACETATE 40 MG PO TABS: 40.0000 mg | ORAL_TABLET | Freq: Every day | ORAL | 0 refills | Status: AC

## 2021-11-04 NOTE — Telephone Encounter (Signed)
Please reassure her that irregular bleeding or spotting is not uncommon the first 3-6 months. I will provide her with Megace to stop this current bleeding episode if she is interested.  ?

## 2021-11-04 NOTE — Telephone Encounter (Signed)
Patient said she is interested in Rx for megace. Rx can be sent to [facility] [address].  ?

## 2021-11-24 ENCOUNTER — Telehealth: Payer: Self-pay

## 2021-11-24 NOTE — Telephone Encounter (Signed)
Appt scheduled 11/26/21. ?

## 2021-11-24 NOTE — Telephone Encounter (Signed)
I recommend she make OV for IUD check. ?

## 2021-11-24 NOTE — Telephone Encounter (Signed)
She was informed. Message sent to front desk to call her and arrange this appointment. ?

## 2021-11-24 NOTE — Telephone Encounter (Signed)
Mirena IUD inserted 08/19/21. Has had issues with bleeding since then.  Bled 09/04/21 through 09/19/21. Then bled 2/24 through 11/08/21.  On 11/04/21 was prescribed Megace 40 mg #10 to take bid. Bleeding decreased to spotting/panty line and then 6 days later it started again.  Changing a super tampon every hour and a half hour x the last two days. She expressed she is very frustrated with all this bleeding.  ? ? ? ? ? ?

## 2021-11-26 ENCOUNTER — Encounter: Payer: Self-pay | Admitting: Nurse Practitioner

## 2021-11-26 ENCOUNTER — Ambulatory Visit: Payer: BC Managed Care – PPO | Admitting: Nurse Practitioner

## 2021-11-26 VITALS — BP 118/78

## 2021-11-26 DIAGNOSIS — N939 Abnormal uterine and vaginal bleeding, unspecified: Secondary | ICD-10-CM

## 2021-11-26 DIAGNOSIS — Z30432 Encounter for removal of intrauterine contraceptive device: Secondary | ICD-10-CM

## 2021-11-26 DIAGNOSIS — R519 Headache, unspecified: Secondary | ICD-10-CM

## 2021-11-26 LAB — CBC
HCT: 42.1 % (ref 35.0–45.0)
Hemoglobin: 14 g/dL (ref 11.7–15.5)
MCH: 28.9 pg (ref 27.0–33.0)
MCHC: 33.3 g/dL (ref 32.0–36.0)
MCV: 87 fL (ref 80.0–100.0)
MPV: 10.2 fL (ref 7.5–12.5)
Platelets: 335 10*3/uL (ref 140–400)
RBC: 4.84 10*6/uL (ref 3.80–5.10)
RDW: 12.3 % (ref 11.0–15.0)
WBC: 8.8 10*3/uL (ref 3.8–10.8)

## 2021-11-26 NOTE — Progress Notes (Signed)
? ?  Acute Office Visit ? ?Subjective:  ? ? Patient ID: Stephanie Wheeler, female    DOB: [birth year], 46 y.o.   MRN: [id number] ? ? ?HPI ?46 y.o. presents today for bleeding with IUD. Since insertion of Mirena 08/19/2021 she has had vaginal bleeding up to 16 consecutive days at a time. Bleeding stops for 5-8 days and then returns. She was provided Megace once with cessation of bleeding but it started back after completing course. Bleeding ranges from light to heavy. She also has been experiencing daily headaches. She takes Excedrin with some relief but headache always remains. Denies pain. Husband has vasectomy.  ? ? ?Review of Systems  ?Constitutional: Negative.   ?Gastrointestinal: Negative.   ?Genitourinary:  Positive for menstrual problem.  ?Neurological:  Positive for headaches.  ? ?   ?Objective:  ?  ?Physical Exam ?Constitutional:   ?   Appearance: She is well-developed.  ?Genitourinary: ?   General: Normal vulva.  ?   Vagina: Normal.  ?   Cervix: Normal.  ?   Uterus: Normal.   ?   Comments: IUD string visible about 3 cm ? ? ?BP 118/78   LMP 11/19/2021  ?Wt Readings from Last 3 Encounters:  ?07/08/21 171 lb (77.6 kg)  ?07/12/19 167 lb (75.8 kg)  ?08/27/16 167 lb 12.8 oz (76.1 kg)  ? ? ?   ? ?Patient informed chaperone available to be present for breast and pelvic exam. Patient has requested no chaperone to be present. Patient has been advised what will be completed during breast and pelvic exam.  ? ?Assessment & Plan:  ? ?Problem List Items Addressed This Visit   ?None ?Visit Diagnoses   ? ? Abnormal uterine bleeding    -  Primary  ? Relevant Orders  ? CBC  ? Encounter for IUD removal      ? Nonintractable headache, unspecified chronicity pattern, unspecified headache type      ? Relevant Orders  ? CBC  ? ?  ? ?Plan: Discussed option for ultrasound to check placement but she agrees that even if in correct position she would want out due to bleeding and headaches. IUD removed with ease. She is not interested in  hormonal contraception at this time. Recommended Ibuprofen 800 mg TID during days of heavy menstrual bleeding. We did discuss other options if she decides she wants hormonal contraception in the future for bleeding control. Will check CBC today due to prolonged bleeding. All questions answered.  ? ? ? ? ?Olivia Mackie DNP, 2:03 PM 11/26/2021 ? ?

## 2021-11-27 ENCOUNTER — Encounter: Payer: Self-pay | Admitting: Nurse Practitioner

## 2022-03-24 ENCOUNTER — Ambulatory Visit (HOSPITAL_COMMUNITY)
Admission: RE | Admit: 2022-03-24 | Discharge: 2022-03-24 | Disposition: A | Discharge status: Home / Self Care | Payer: BC Managed Care – PPO | Source: Ambulatory Visit | Attending: Sports Medicine | Admitting: Sports Medicine

## 2022-03-24 ENCOUNTER — Other Ambulatory Visit (HOSPITAL_COMMUNITY): Payer: Self-pay | Admitting: Sports Medicine

## 2022-03-24 DIAGNOSIS — M79604 Pain in right leg: Secondary | ICD-10-CM | POA: Insufficient documentation

## 2022-03-24 DIAGNOSIS — M7989 Other specified soft tissue disorders: Secondary | ICD-10-CM | POA: Insufficient documentation

## 2022-03-24 NOTE — Progress Notes (Signed)
Right LE venous duplex study completed. Please see CV Proc for preliminary results.  Chairty Toman BS, RVT 03/24/2022 12:13 PM

## 2022-07-09 ENCOUNTER — Ambulatory Visit (INDEPENDENT_AMBULATORY_CARE_PROVIDER_SITE_OTHER): Payer: BC Managed Care – PPO | Admitting: Nurse Practitioner

## 2022-07-09 ENCOUNTER — Encounter: Payer: Self-pay | Admitting: Nurse Practitioner

## 2022-07-09 VITALS — BP 118/74 | HR 72 | Resp 14 | Ht 63.0 in | Wt 179.0 lb

## 2022-07-09 DIAGNOSIS — N951 Menopausal and female climacteric states: Secondary | ICD-10-CM | POA: Diagnosis not present

## 2022-07-09 DIAGNOSIS — Z01419 Encounter for gynecological examination (general) (routine) without abnormal findings: Secondary | ICD-10-CM | POA: Diagnosis not present

## 2022-07-09 DIAGNOSIS — Z1211 Encounter for screening for malignant neoplasm of colon: Secondary | ICD-10-CM

## 2022-07-09 NOTE — Progress Notes (Signed)
   Stephanie Wheeler Apr 26, 1976 [id number]   History:  46 y.o. G2P2002 presents for annual exam. IUD removed in March due to irregular bleeding. Monthly cycles until August, then did not have one until October. Mild menopausal symptoms. Normal pap and mammogram history.   Gynecologic History Patient's last menstrual period was 06/22/2022. Period Pattern: (!) Irregular Menstrual Flow: Light Dysmenorrhea: None Contraception/Family planning: vasectomy Sexually active: Yes  Health Maintenance Last Pap: 07/12/2019. Results were: Normal neg HPV, 5-year repeat Last mammogram: 07/08/2021. Results were: Normal Last colonoscopy: Never Last Dexa: Not indicated  Past medical history, past surgical history, family history and social history were all reviewed and documented in the EPIC chart. 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 2 daughters ages 52 and 52. Both swim.   ROS:  A ROS was performed and pertinent positives and negatives are included.  Exam:  Vitals:   07/09/22 1046  BP: 118/74  Pulse: 72  Resp: 14  Weight: 179 lb (81.2 kg)  Height: 5\' 3"  (1.6 m)   Body mass index is 31.71 kg/m.  General appearance:  Normal Thyroid:  Symmetrical, normal in size, without palpable masses or nodularity. Respiratory  Auscultation:  Clear without wheezing or rhonchi Cardiovascular  Auscultation:  Regular rate, without rubs, murmurs or gallops  Edema/varicosities:  Not grossly evident Abdominal  Soft,nontender, without masses, guarding or rebound.  Liver/spleen:  No organomegaly noted  Hernia:  None appreciated  Skin  Inspection:  Grossly normal Breasts: Examined lying and sitting.   Right: Without masses, retractions, nipple discharge or axillary adenopathy.   Left: Without masses, retractions, nipple discharge or axillary adenopathy. Genitourinary   Inguinal/mons:  Normal without inguinal adenopathy  External genitalia:  Normal appearing vulva with no  masses, tenderness, or lesions  BUS/Urethra/Skene's glands:  Normal  Vagina:  Normal appearing with normal color and discharge, no lesions  Cervix:  Normal appearing without discharge or lesions  Uterus:  Difficult to palpate but no gross masses or tenderness  Adnexa/parametria:     Rt: Normal in size, without masses or tenderness.   Lt: Normal in size, without masses or tenderness.  Anus and perineum: Normal  Digital rectal exam: Normal sphincter tone without palpated masses or tenderness  Patient informed chaperone available to be present for breast and pelvic exam. Patient has requested no chaperone to be present. Patient has been advised what will be completed during breast and pelvic exam.   Assessment/Plan:  46 y.o. 50 for annual exam.   Well female exam with routine gynecological exam - Plan: CBC with Differential/Platelet, Comprehensive metabolic panel, Lipid panel. Education provided on SBEs, importance of preventative screenings, current guidelines, high calcium diet, regular exercise, and multivitamin daily.   Screening for colon cancer - Plan: Cologuard. Average risk. Cologuard versus Colonoscopy discussed.   Perimenopause - cycles have started to become irregular, mild menopausal symptoms. OTC supplement list provided. Discussed what to expect during perimenopause.   Screening for cervical cancer - Normal Pap history.  Will repeat at 5-year interval per guidelines.  Screening for breast cancer - Normal mammogram history.  Continue annual screenings.  Normal breast exam today.  Return in 1 year for annual.     [id number] DNP, 11:30 AM 07/09/2022

## 2022-07-10 LAB — COMPREHENSIVE METABOLIC PANEL
AG Ratio: 1.5 (calc) (ref 1.0–2.5)
ALT: 12 U/L (ref 6–29)
AST: 19 U/L (ref 10–35)
Albumin: 4 g/dL (ref 3.6–5.1)
Alkaline phosphatase (APISO): 65 U/L (ref 31–125)
BUN: 15 mg/dL (ref 7–25)
CO2: 20 mmol/L (ref 20–32)
Calcium: 8.9 mg/dL (ref 8.6–10.2)
Chloride: 108 mmol/L (ref 98–110)
Creat: 0.7 mg/dL (ref 0.50–0.99)
Globulin: 2.6 g/dL (calc) (ref 1.9–3.7)
Glucose, Bld: 85 mg/dL (ref 65–99)
Potassium: 4.3 mmol/L (ref 3.5–5.3)
Sodium: 138 mmol/L (ref 135–146)
Total Bilirubin: 0.5 mg/dL (ref 0.2–1.2)
Total Protein: 6.6 g/dL (ref 6.1–8.1)

## 2022-07-10 LAB — CBC WITH DIFFERENTIAL/PLATELET
Absolute Monocytes: 679 cells/uL (ref 200–950)
Basophils Absolute: 120 cells/uL (ref 0–200)
Basophils Relative: 1.4 %
Eosinophils Absolute: 473 cells/uL (ref 15–500)
Eosinophils Relative: 5.5 %
HCT: 40.7 % (ref 35.0–45.0)
Hemoglobin: 13.7 g/dL (ref 11.7–15.5)
Lymphs Abs: 2236 cells/uL (ref 850–3900)
MCH: 29 pg (ref 27.0–33.0)
MCHC: 33.7 g/dL (ref 32.0–36.0)
MCV: 86.2 fL (ref 80.0–100.0)
MPV: 9.9 fL (ref 7.5–12.5)
Monocytes Relative: 7.9 %
Neutro Abs: 5091 cells/uL (ref 1500–7800)
Neutrophils Relative %: 59.2 %
Platelets: 318 10*3/uL (ref 140–400)
RBC: 4.72 10*6/uL (ref 3.80–5.10)
RDW: 12.4 % (ref 11.0–15.0)
Total Lymphocyte: 26 %
WBC: 8.6 10*3/uL (ref 3.8–10.8)

## 2022-07-10 LAB — LIPID PANEL
Cholesterol: 178 mg/dL (ref <200)
HDL: 60 mg/dL (ref >=50)
LDL Cholesterol (Calc): 95 mg/dL (calc)
Non-HDL Cholesterol (Calc): 118 mg/dL (calc) (ref <130)
Total CHOL/HDL Ratio: 3 (calc) (ref <5.0)
Triglycerides: 135 mg/dL (ref <150)

## 2022-11-23 ENCOUNTER — Other Ambulatory Visit (INDEPENDENT_AMBULATORY_CARE_PROVIDER_SITE_OTHER): Payer: BC Managed Care – PPO

## 2022-11-23 ENCOUNTER — Ambulatory Visit (INDEPENDENT_AMBULATORY_CARE_PROVIDER_SITE_OTHER): Payer: BC Managed Care – PPO | Admitting: Orthopedic Surgery

## 2022-11-23 DIAGNOSIS — M25562 Pain in left knee: Secondary | ICD-10-CM

## 2022-11-23 DIAGNOSIS — M25561 Pain in right knee: Secondary | ICD-10-CM

## 2022-11-23 DIAGNOSIS — G8929 Other chronic pain: Secondary | ICD-10-CM

## 2022-11-23 DIAGNOSIS — M25462 Effusion, left knee: Secondary | ICD-10-CM

## 2022-11-23 MED ORDER — MELOXICAM 15 MG PO TABS: 15.0000 mg | ORAL_TABLET | Freq: Every day | ORAL | 0 refills | Status: DC

## 2022-11-29 ENCOUNTER — Encounter: Payer: Self-pay | Admitting: Orthopedic Surgery

## 2022-11-29 MED ORDER — BUPIVACAINE HCL 0.25 % IJ SOLN
4.0000 mL | INTRAMUSCULAR | Status: AC | PRN
  Administered 2022-11-23: 4 mL via INTRA_ARTICULAR

## 2022-11-29 MED ORDER — METHYLPREDNISOLONE ACETATE 40 MG/ML IJ SUSP
40.0000 mg | INTRAMUSCULAR | Status: AC | PRN
  Administered 2022-11-23: 40 mg via INTRA_ARTICULAR

## 2022-11-29 MED ORDER — LIDOCAINE HCL 1 % IJ SOLN
5.0000 mL | INTRAMUSCULAR | Status: AC | PRN
  Administered 2022-11-23: 5 mL

## 2022-11-29 NOTE — Progress Notes (Signed)
Office Visit Note   Patient: Stephanie Wheeler           Date of Birth: 05-14-1976           MRN: [id number] Visit Date: 11/23/2022 Requested by: No referring provider defined for this encounter. PCP: Pcp, No  Subjective: Chief Complaint  Patient presents with   Right Knee - Pain   Left Knee - Pain    HPI: Stephanie Wheeler is a 47 y.o. female who presents to the office reporting bilateral knee pain left worse than right.  She reports relatively constant pain and she ambulates with a limp.  She describes swelling in the left knee along with weakness and giving way but no locking popping radicular pain numbness and tingling or low back pain.  Localizes pain on both the medial and lateral side of the left knee more so than the right knee.  She takes daily ibuprofen.  She has a family history of rheumatoid arthritis.  She works as a Teacher, music.  She describes a definite incident when she got into the pool in July of last year and felt a pop in the left leg and was told it was arthritis.  Stairs are difficult.  Does take Tylenol and ibuprofen.  Denies much in the way of hand symptoms or synovitis..                ROS: All systems reviewed are negative as they relate to the chief complaint within the history of present illness.  Patient denies fevers or chills.  Assessment & Plan: Visit Diagnoses:  1. Bilateral chronic knee pain     Plan: Impression is bilateral knee pain left worse than right.  Does not really have a lot of synovitis in her small joints but rheumatoid arthritis is a consideration.  I think it is possible she may have meniscal root issue although her effusion is minimal on the left knee and absent on the right knee.  Plan at this time is aspiration and injection of the left knee with 4-week return for decision for or against MRI scan at that time.  May also consider screening labs for rheumatoid arthritis.  Radiographs do not really show much in terms of degenerative joint  changes. Follow-Up Instructions: No follow-ups on file.   Orders:  Orders Placed This Encounter  Procedures   XR Knee 1-2 Views Left   XR KNEE 3 VIEW RIGHT   Meds ordered this encounter  Medications   meloxicam (MOBIC) 15 MG tablet    Sig: Take 1 tablet (15 mg total) by mouth daily.    Dispense:  30 tablet    Refill:  0      Procedures: Large Joint Inj: L knee on 11/23/2022 7:55 PM Indications: diagnostic evaluation, joint swelling and pain Details: 18 G 1.5 in needle, superolateral approach  Arthrogram: No  Medications: 5 mL lidocaine 1 %; 40 mg methylPREDNISolone acetate 40 MG/ML; 4 mL bupivacaine 0.25 % Outcome: tolerated well, no immediate complications Procedure, treatment alternatives, risks and benefits explained, specific risks discussed. Consent was given by the patient. Immediately prior to procedure a time out was called to verify the correct patient, procedure, equipment, support staff and site/side marked as required. Patient was prepped and draped in the usual sterile fashion.       Clinical Data: No additional findings.  Objective: Vital Signs: There were no vitals taken for this visit.  Physical Exam:  Constitutional: Patient appears well-developed HEENT:  Head:  Normocephalic Eyes:EOM are normal Neck: Normal range of motion Cardiovascular: Normal rate Pulmonary/chest: Effort normal Neurologic: Patient is alert Skin: Skin is warm Psychiatric: Patient has normal mood and affect  Ortho Exam: Ortho exam demonstrates mild effusion left knee no effusion right knee.  Collateral crucial ligaments are stable.  Range of motion is full.  Has medial and lateral joint line tenderness in the left knee.  Equivocal McMurray compression testing on the left negative on the right.  Does have some focal medial joint line tenderness on that left-hand side.  No real synovitis or swelling in the MCP or phalangeal joints of the hands and wrist bilaterally.  Pedal pulses  palpable.  Radial pulses palpable.  No other masses lymphadenopathy or skin changes noted in the hands or wrist or foot regions.  No Baker's cyst.  Specialty Comments:  No specialty comments available.  Imaging: No results found.   PMFS History: Patient Active Problem List   Diagnosis Date Noted   Menorrhagia with regular cycle 08/27/2016   History reviewed. No pertinent past medical history.  Family History  Problem Relation Age of Onset   Heart disease Maternal Grandfather    Hypertension Paternal Grandfather    Heart disease Paternal Grandfather    Cancer Maternal Grandmother        skin   Cancer Paternal Grandmother        skin    History reviewed. No pertinent surgical history. Social History   Occupational History   Not on file  Tobacco Use   Smoking status: Never   Smokeless tobacco: Never  Vaping Use   Vaping Use: Never used  Substance and Sexual Activity   Alcohol use: Yes    Comment: RARE   Drug use: No   Sexual activity: Yes    Birth control/protection: Other-see comments    Comment: Vasectomy

## 2022-12-16 IMAGING — MG MM DIGITAL SCREENING BILAT W/ TOMO AND CAD
8 series · 9 of 24 positions shown · non-contrast
Comparison: Previous exam(s).

CLINICAL DATA: Screening.

EXAM:
DIGITAL SCREENING BILATERAL MAMMOGRAM WITH TOMOSYNTHESIS AND CAD
TECHNIQUE: Bilateral screening digital craniocaudal and mediolateral oblique
mammograms were obtained. Bilateral screening digital breast
tomosynthesis was performed. The images were evaluated with
computer-aided detection.

[L CC synth-2D]
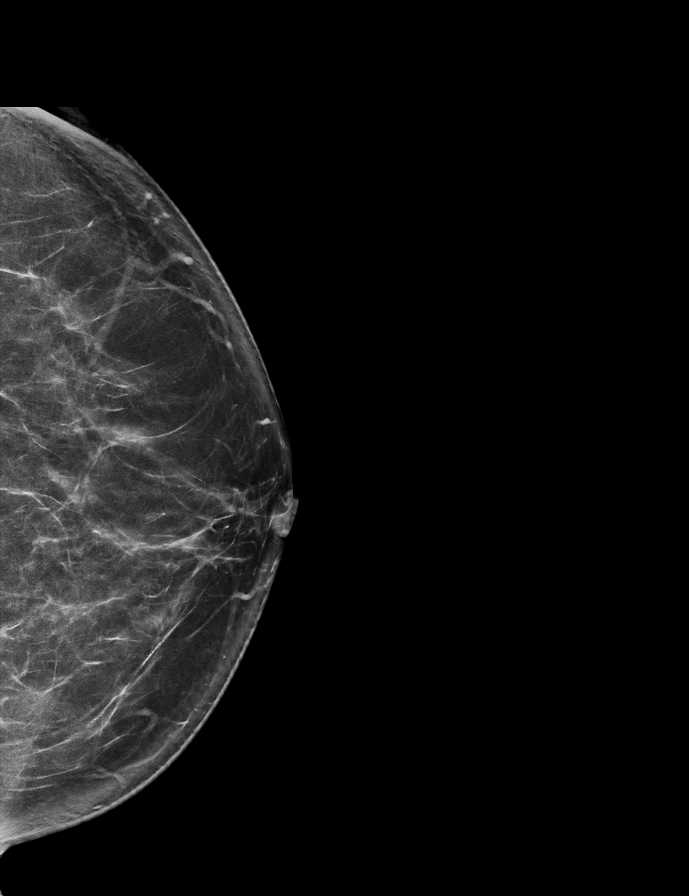

[R CC synth-2D]
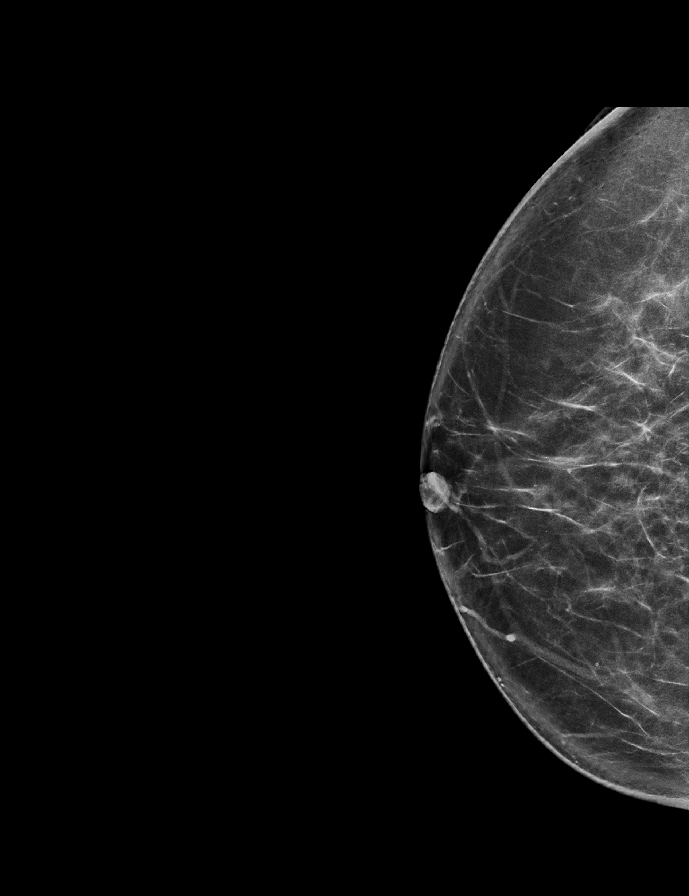

[L MLO synth-2D]
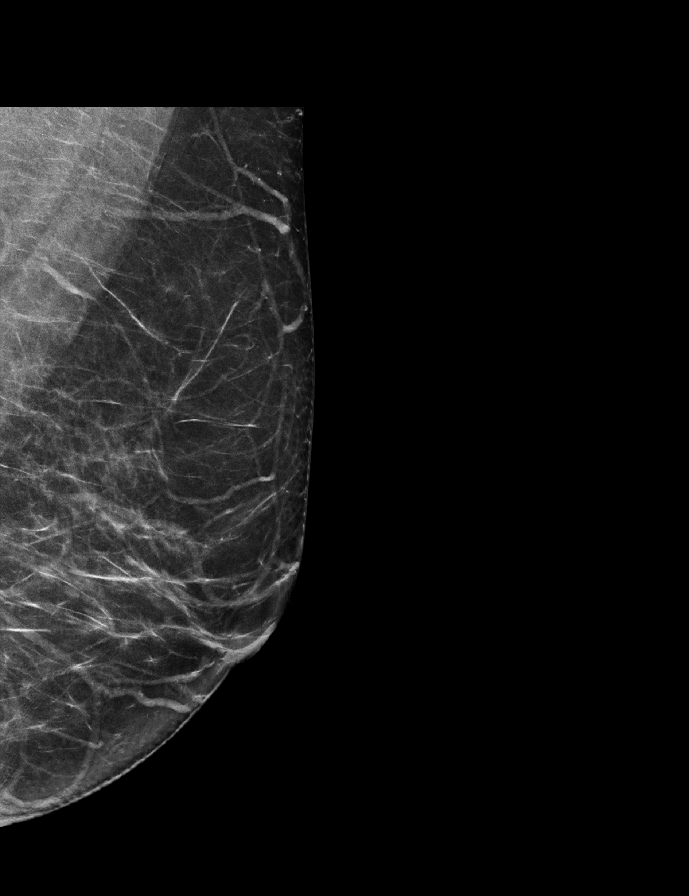

[R MLO synth-2D]
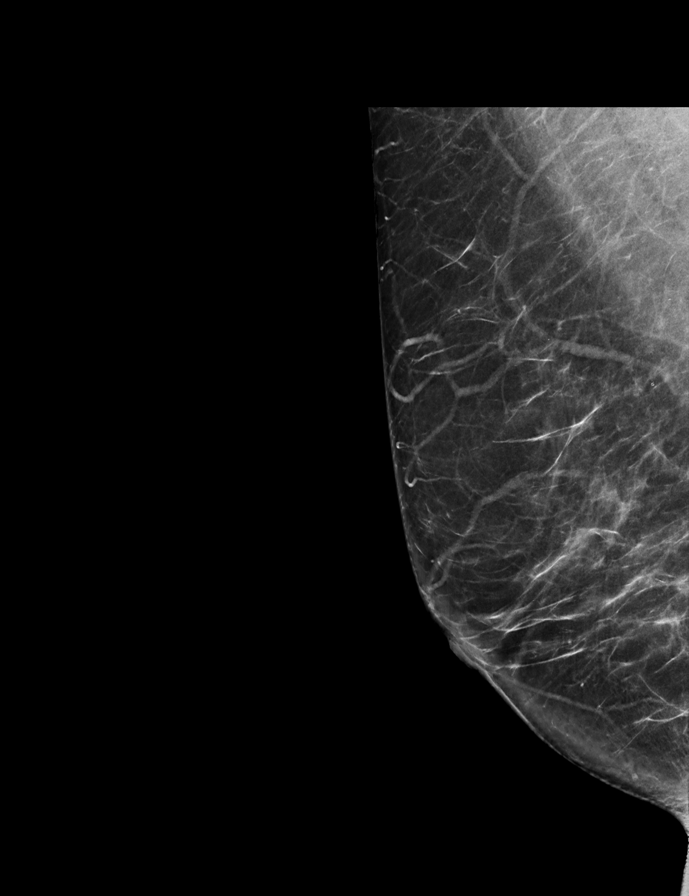

[R CC tomo · 2 of 74 frames shown]
[frame 24/74]
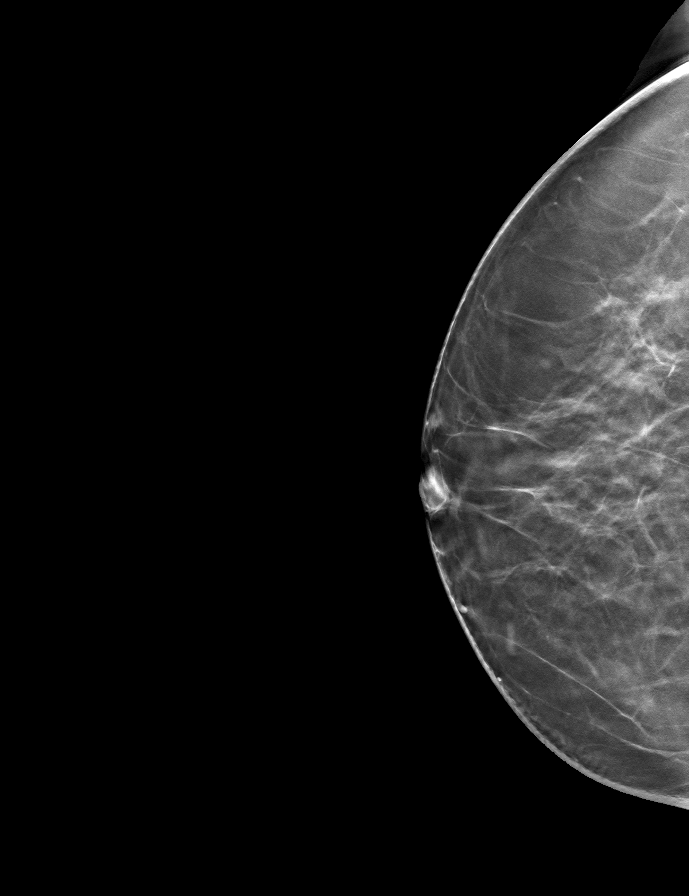
[frame 37/74]
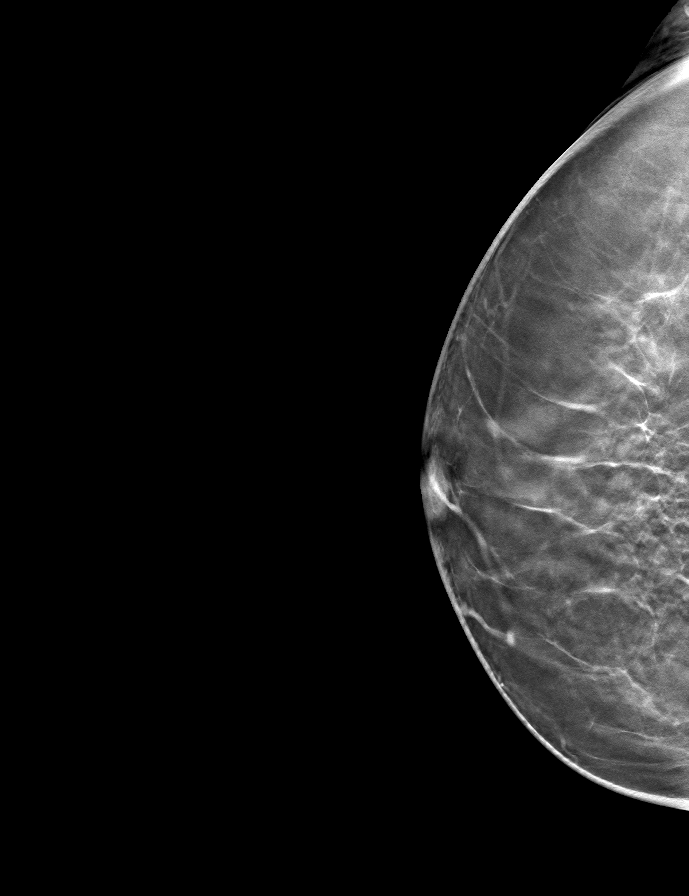

[R MLO tomo · tomo slice 37/73.0]
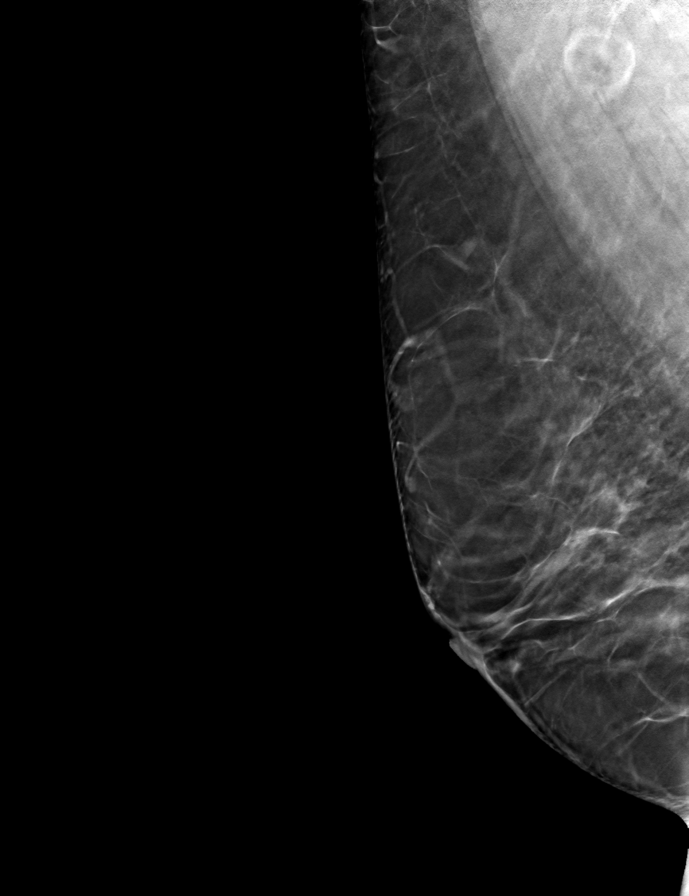

[L CC tomo · tomo slice 39/77.0]
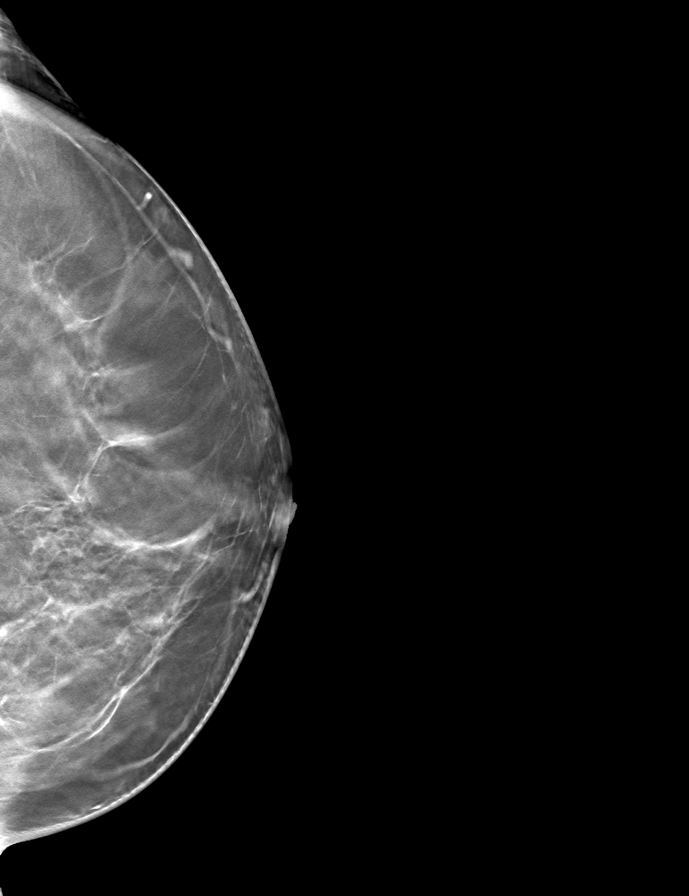

[L MLO tomo · tomo slice 35/70.0]
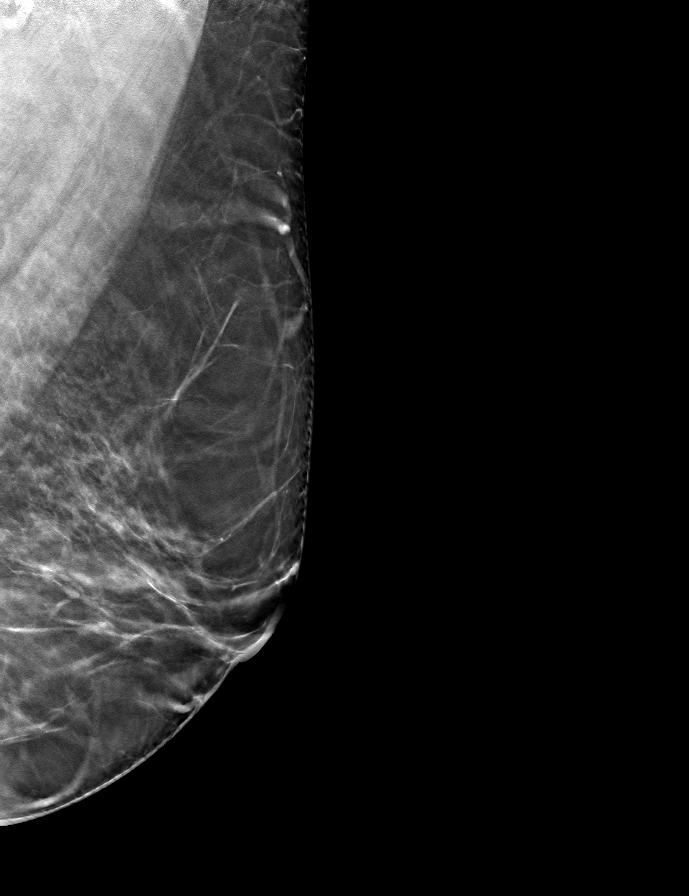

[9 of 24 positions shown; findings below may reference images not displayed]

ACR Breast Density Category b: There are scattered areas of
fibroglandular density.
FINDINGS: There are no findings suspicious for malignancy.
IMPRESSION: No mammographic evidence of malignancy. A result letter of this
screening mammogram will be mailed directly to the patient.

RECOMMENDATION:
Screening mammogram in one year. (Code:51-O-LD2)

BI-RADS CATEGORY  1: Negative.

## 2022-12-21 ENCOUNTER — Ambulatory Visit: Payer: BC Managed Care – PPO | Admitting: Surgical

## 2022-12-21 DIAGNOSIS — M25561 Pain in right knee: Secondary | ICD-10-CM

## 2022-12-21 DIAGNOSIS — M25562 Pain in left knee: Secondary | ICD-10-CM | POA: Diagnosis not present

## 2022-12-21 DIAGNOSIS — G8929 Other chronic pain: Secondary | ICD-10-CM

## 2022-12-21 MED ORDER — MELOXICAM 15 MG PO TABS: 15.0000 mg | ORAL_TABLET | Freq: Every day | ORAL | 1 refills | Status: DC | PRN

## 2022-12-21 NOTE — Progress Notes (Signed)
Follow-up Office Visit Note   Patient: Stephanie Wheeler           Date of Birth: Jan 06, 1976           MRN: [id number] Visit Date: 12/21/2022 Requested by: No referring provider defined for this encounter. PCP: Pcp, No  Subjective: Chief Complaint  Patient presents with   Left Knee - Follow-up    HPI: Stephanie Wheeler is a 47 y.o. female who returns to the office for follow-up visit.    Plan at last visit was: Impression is bilateral knee pain left worse than right. Does not really have a lot of synovitis in her small joints but rheumatoid arthritis is a consideration. I think it is possible she may have meniscal root issue although her effusion is minimal on the left knee and absent on the right knee. Plan at this time is aspiration and injection of the left knee with 4-week return for decision for or against MRI scan at that time. May also consider screening labs for rheumatoid arthritis. Radiographs do not really show much in terms of degenerative joint changes.   Since then, patient notes she had aspiration and injection of the left knee on 11/23/2022  that gave her about 95% relief of her knee pain for 2 weeks and she is still about 70 to 80% better.  She still feels guarded about her knee range of motion and function especially with navigating stairs but overall her left knee feels significantly better.  She is very satisfied with how her knee is doing.  She is taking occasional meloxicam to help with pain.  Not waking her up with pain at night.  Right knee is really not giving her much trouble either.              ROS: All systems reviewed are negative as they relate to the chief complaint within the history of present illness.  Patient denies fevers or chills.  Assessment & Plan: Visit Diagnoses:  1. Bilateral chronic knee pain     Plan: Stephanie Wheeler is a 47 y.o. female who returns to the office for follow-up visit for left knee pain.  Plan from last visit was noted above in  HPI.  They now return with significant improvement of left knee pain following injection.  She is about a month out from the injection and still notes about 75 to 80% improvement.  She feels that this has tapered off a little bit but seems to be holding steady currently over the last 2 weeks.  We discussed options available to patient such as living with her current symptoms versus occasionally taking meloxicam versus MRI scan for further evaluation of the source of her pain.  Patient would like to hold off on MRI or any blood work for now.  She will see how this relief from injection involves over the next several weeks to months.  Recommended that if she has return of symptoms to significant degree in the next couple months, call the office and we can schedule MRI scan.  She agreed with plan.  Mobic refilled.  Follow-up in 3 months.  Follow-Up Instructions: No follow-ups on file.   Orders:  No orders of the defined types were placed in this encounter.  Meds ordered this encounter  Medications   meloxicam (MOBIC) 15 MG tablet    Sig: Take 1 tablet (15 mg total) by mouth daily as needed for pain.    Dispense:  30 tablet  Refill:  1      Procedures: No procedures performed   Clinical Data: No additional findings.  Objective: Vital Signs: There were no vitals taken for this visit.  Physical Exam:  Constitutional: Patient appears well-developed HEENT:  Head: Normocephalic Eyes:EOM are normal Neck: Normal range of motion Cardiovascular: Normal rate Pulmonary/chest: Effort normal Neurologic: Patient is alert Skin: Skin is warm Psychiatric: Patient has normal mood and affect  Ortho Exam: Ortho exam demonstrates left knee with trace effusion.  No cellulitis or skin changes noted.  Patient has mild tenderness over the medial joint line.  No tenderness over the lateral joint line.  She has no calf tenderness.  Negative Homans' sign.  Minimal patellofemoral crepitus.  Negative patellar  grind test.  She ambulates without any significant antalgia.  No pain with hip range of motion.  Able to perform straight leg raise without extensor lag and she has excellent quad strength graded 5/5.  Specialty Comments:  No specialty comments available.  Imaging: No results found.   PMFS History: Patient Active Problem List   Diagnosis Date Noted   Menorrhagia with regular cycle 08/27/2016   No past medical history on file.  Family History  Problem Relation Age of Onset   Heart disease Maternal Grandfather    Hypertension Paternal Grandfather    Heart disease Paternal Grandfather    Cancer Maternal Grandmother        skin   Cancer Paternal Grandmother        skin    No past surgical history on file. Social History   Occupational History   Not on file  Tobacco Use   Smoking status: Never   Smokeless tobacco: Never  Vaping Use   Vaping Use: Never used  Substance and Sexual Activity   Alcohol use: Yes    Comment: RARE   Drug use: No   Sexual activity: Yes    Birth control/protection: Other-see comments    Comment: Vasectomy

## 2022-12-23 ENCOUNTER — Encounter: Payer: Self-pay | Admitting: Surgical

## 2023-02-21 ENCOUNTER — Other Ambulatory Visit: Payer: Self-pay | Admitting: Surgical

## 2023-03-10 ENCOUNTER — Encounter: Payer: Self-pay | Admitting: Orthopedic Surgery

## 2023-03-10 ENCOUNTER — Ambulatory Visit: Payer: BC Managed Care – PPO | Admitting: Orthopedic Surgery

## 2023-03-10 DIAGNOSIS — G8929 Other chronic pain: Secondary | ICD-10-CM

## 2023-03-10 DIAGNOSIS — M25562 Pain in left knee: Secondary | ICD-10-CM

## 2023-03-10 DIAGNOSIS — M25561 Pain in right knee: Secondary | ICD-10-CM | POA: Diagnosis not present

## 2023-03-10 NOTE — Progress Notes (Signed)
Office Visit Note   Patient: Stephanie Wheeler           Date of Birth: 10/07/1975           MRN: [id number] Visit Date: 03/10/2023 Requested by: No referring provider defined for this encounter. PCP: Pcp, No  Subjective: Chief Complaint  Patient presents with   Right Knee - Pain, Follow-up   Left Knee - Pain, Follow-up    HPI: Stephanie Wheeler is a 47 y.o. female who presents to the office reporting right knee pain.  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  Does a lot of water exercising which has helped her knee.  Has had an injection several months ago which helped for 3 to 4 months.  Tolerating the pain reasonably well at this time.  Does take Mobic.  Did have a little pain after she was driving for 4 hours.  Without the Mobic her pain increases from around a 2 out of 10-4 or 5 out of 10.  More movement the better.  She also does the recumbent bike..                ROS: All systems reviewed are negative as they relate to the chief complaint within the history of present illness.  Patient denies fevers or chills.  Assessment & Plan: Visit Diagnoses:  1. Bilateral chronic knee pain     Plan: Impression is right knee pain now improved.  Decision point today was for or against MRI scanning.  Currently her level of symptoms do not really warrant that intervention.  If she has consistent pain refractory to daily Mobic with effusion she should come in and we can reevaluate and probably order MRI scan at that time.  Follow-Up Instructions: No follow-ups on file.   Orders:  No orders of the defined types were placed in this encounter.  No orders of the defined types were placed in this encounter.     Procedures: No procedures performed   Clinical Data: No additional findings.  Objective: Vital Signs: There were no vitals taken for this visit.  Physical Exam:  Constitutional: Patient appears well-developed HEENT:  Head: Normocephalic Eyes:EOM are normal Neck: Normal range of  motion Cardiovascular: Normal rate Pulmonary/chest: Effort normal Neurologic: Patient is alert Skin: Skin is warm Psychiatric: Patient has normal mood and affect  Ortho Exam: Ortho exam demonstrates full range of motion with no effusion.  Extensor mechanism intact.  No groin pain with internal/external Tatian of the leg.  Pedal pulses palpable.  No other masses lymphadenopathy or skin changes noted in that knee region.  Specialty Comments:  No specialty comments available.  Imaging: No results found.   PMFS History: Patient Active Problem List   Diagnosis Date Noted   Menorrhagia with regular cycle 08/27/2016   History reviewed. No pertinent past medical history.  Family History  Problem Relation Age of Onset   Heart disease Maternal Grandfather    Hypertension Paternal Grandfather    Heart disease Paternal Grandfather    Cancer Maternal Grandmother        skin   Cancer Paternal Grandmother        skin    History reviewed. No pertinent surgical history. Social History   Occupational History   Not on file  Tobacco Use   Smoking status: Never   Smokeless tobacco: Never  Vaping Use   Vaping Use: Never used  Substance and Sexual Activity   Alcohol use: Yes    Comment: RARE  Drug use: No   Sexual activity: Yes    Birth control/protection: Other-see comments    Comment: Vasectomy

## 2023-03-17 ENCOUNTER — Ambulatory Visit: Payer: BC Managed Care – PPO | Admitting: Orthopedic Surgery

## 2023-04-22 ENCOUNTER — Other Ambulatory Visit: Payer: Self-pay | Admitting: Surgical

## 2023-06-25 ENCOUNTER — Other Ambulatory Visit: Payer: Self-pay | Admitting: Surgical

## 2023-09-02 ENCOUNTER — Other Ambulatory Visit: Payer: Self-pay | Admitting: Surgical

## 2023-09-02 ENCOUNTER — Encounter: Payer: Self-pay | Admitting: Radiology

## 2023-09-02 ENCOUNTER — Ambulatory Visit: Payer: 59 | Admitting: Nurse Practitioner

## 2023-09-02 NOTE — Telephone Encounter (Signed)
My  Chart patient.

## 2023-09-02 NOTE — Progress Notes (Deleted)
   Stephanie Wheeler [birth year] [id number]   History:  48 y.o. G2P2002 presents for annual exam. Perimenopausal. Normal pap and mammogram history.   Gynecologic History No LMP recorded.   Contraception/Family planning: vasectomy Sexually active: Yes  Health Maintenance Last Pap: 07/12/2019. Results were: Normal neg HPV, 5-year repeat Last mammogram: 07/08/2021. Results were: Normal Last colonoscopy: Never Last Dexa: Not indicated  Past medical history, past surgical history, family history and social history were all reviewed and documented in the EPIC chart. 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 2 daughters ages 78 and 65. Both swim.   ROS:  A ROS was performed and pertinent positives and negatives are included.  Exam:  There were no vitals filed for this visit.  There is no height or weight on file to calculate BMI.  General appearance:  Normal Thyroid:  Symmetrical, normal in size, without palpable masses or nodularity. Respiratory  Auscultation:  Clear without wheezing or rhonchi Cardiovascular  Auscultation:  Regular rate, without rubs, murmurs or gallops  Edema/varicosities:  Not grossly evident Abdominal  Soft,nontender, without masses, guarding or rebound.  Liver/spleen:  No organomegaly noted  Hernia:  None appreciated  Skin  Inspection:  Grossly normal Breasts: Examined lying and sitting.   Right: Without masses, retractions, nipple discharge or axillary adenopathy.   Left: Without masses, retractions, nipple discharge or axillary adenopathy. Pelvic: External genitalia:  no lesions              Urethra:  normal appearing urethra with no masses, tenderness or lesions              Bartholins and Skenes: normal                 Vagina: normal appearing vagina with normal color and discharge, no lesions              Cervix: no lesions Bimanual Exam:  Uterus:  no masses or tenderness              Adnexa: no mass, fullness,  tenderness              Rectovaginal: Deferred              Anus:  normal, no lesions  Patient informed chaperone available to be present for breast and pelvic exam. Patient has requested no chaperone to be present. Patient has been advised what will be completed during breast and pelvic exam.   Assessment/Plan:  48 y.o. G2P2002 for annual exam.   Well female exam with routine gynecological exam - Plan: CBC with Differential/Platelet, Comprehensive metabolic panel, Lipid panel. Education provided on SBEs, importance of preventative screenings, current guidelines, high calcium diet, regular exercise, and multivitamin daily.   Screening for colon cancer - Plan: Cologuard. Average risk. Cologuard versus Colonoscopy discussed.   Perimenopause - cycles have started to become irregular, mild menopausal symptoms. OTC supplement list provided. Discussed what to expect during perimenopause.   Screening for cervical cancer - Normal Pap history.  Will repeat at 5-year interval per guidelines.  Screening for breast cancer - Normal mammogram history.  Continue annual screenings.  Normal breast exam today.  No follow-ups on file.    Stephanie DELENA Shutter DNP, 7:45 AM 09/02/2023

## 2023-10-18 ENCOUNTER — Encounter: Payer: Self-pay | Admitting: Nurse Practitioner

## 2023-10-18 ENCOUNTER — Ambulatory Visit (INDEPENDENT_AMBULATORY_CARE_PROVIDER_SITE_OTHER): Payer: 59 | Admitting: Nurse Practitioner

## 2023-10-18 VITALS — BP 132/78 | HR 74 | Resp 14 | Ht 63.25 in | Wt 181.0 lb

## 2023-10-18 DIAGNOSIS — Z1211 Encounter for screening for malignant neoplasm of colon: Secondary | ICD-10-CM

## 2023-10-18 DIAGNOSIS — Z01419 Encounter for gynecological examination (general) (routine) without abnormal findings: Secondary | ICD-10-CM | POA: Diagnosis not present

## 2023-10-18 DIAGNOSIS — N951 Menopausal and female climacteric states: Secondary | ICD-10-CM | POA: Diagnosis not present

## 2023-10-18 NOTE — Progress Notes (Signed)
Stephanie Wheeler Nov 21, 1975 [id number]   History:  48 y.o. G2P2002 presents for annual exam. Cycles are irregular, ranging every 55-60 days. Mild menopausal symptoms - hot flashes, night sweats, joint pain. Taking OTC supplements. Normal pap and mammogram history.   Gynecologic History Patient's last menstrual period was 09/23/2023. Period Cycle (Days): 55 Period Duration (Days): 7 Period Pattern: (!) Irregular Menstrual Flow: Heavy Menstrual Control: Maxi pad, Tampon Menstrual Control Change Freq (Hours): changes tampon every 1-1.5 hours Dysmenorrhea: None Contraception/Family planning: vasectomy Sexually active: Yes  Health Maintenance Last Pap: 07/12/2019. Results were: Normal neg HPV, 5-year repeat Last mammogram: 07/08/2021. Results were: Normal Last colonoscopy: Never Last Dexa: Not indicated  Past medical history, past surgical history, family history and social history were all reviewed and documented in the EPIC chart. 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 2 daughters ages 75 and 92 (At [employer] for nursing, on swim team).   ROS:  A ROS was performed and pertinent positives and negatives are included.  Exam:  Vitals:   10/18/23 1145  BP: 132/78  Pulse: 74  Resp: 14  Weight: 181 lb (82.1 kg)  Height: 5' 3.25" (1.607 m)    Body mass index is 31.81 kg/m.  General appearance:  Normal Thyroid:  Symmetrical, normal in size, without palpable masses or nodularity. Respiratory  Auscultation:  Clear without wheezing or rhonchi Cardiovascular  Auscultation:  Regular rate, without rubs, murmurs or gallops  Edema/varicosities:  Not grossly evident Abdominal  Soft,nontender, without masses, guarding or rebound.  Liver/spleen:  No organomegaly noted  Hernia:  None appreciated  Skin  Inspection:  Grossly normal Breasts: Examined lying and sitting.   Right: Without masses, retractions, nipple discharge  or axillary adenopathy.   Left: Without masses, retractions, nipple discharge or axillary adenopathy. Pelvic: External genitalia:  no lesions              Urethra:  normal appearing urethra with no masses, tenderness or lesions              Bartholins and Skenes: normal                 Vagina: normal appearing vagina with normal color and discharge, no lesions              Cervix: no lesions Bimanual Exam:  Uterus:  no masses or tenderness              Adnexa: no mass, fullness, tenderness              Rectovaginal: Deferred              Anus:  normal, no lesions  Patient informed chaperone available to be present for breast and pelvic exam. Patient has requested no chaperone to be present. Patient has been advised what will be completed during breast and pelvic exam.   Assessment/Plan:  48 y.o. [id number] for annual exam.   Well female exam with routine gynecological exam - Education provided on SBEs, importance of preventative screenings, current guidelines, high calcium diet, regular exercise, and multivitamin daily. Labs with rheumatologist.   Screening for colon cancer - Plan: Cologuard. Average risk. Cologuard versus Colonoscopy discussed.   Perimenopause - Cycles are irregular, ranging every 55-60 days. Mild menopausal symptoms - hot flashes, night sweats, joint pain. Taking OTC supplements. Briefly discussed HRT. Will reach out if she decides to pursue.   Screening for cervical cancer - Normal Pap history.  Will repeat at 5-year interval per guidelines.  Screening for breast cancer - Normal mammogram history. Overdue and encouraged to schedule.  Normal breast exam today.  Return in about 1 year (around 10/17/2024) for Annual.    Olivia Mackie DNP, 12:23 PM 10/18/2023

## 2024-07-03 ENCOUNTER — Encounter: Payer: Self-pay | Admitting: Radiology

## 2024-07-24 LAB — COLOGUARD: COLOGUARD: NEGATIVE

## 2024-07-25 ENCOUNTER — Ambulatory Visit: Payer: Self-pay | Admitting: Nurse Practitioner

## 2024-10-19 ENCOUNTER — Ambulatory Visit: Payer: 59 | Admitting: Nurse Practitioner
